# Patient Record
Sex: Female | Born: 1990 | Race: Black or African American | Hispanic: No | Marital: Single | Smoking: Never smoker
Health system: Southern US, Community
[De-identification: ages and names within clinical notes are randomized; demographics above are authoritative.]

---

## 2014-11-04 ENCOUNTER — Encounter (HOSPITAL_COMMUNITY): Payer: Self-pay | Admitting: *Deleted

## 2014-11-04 ENCOUNTER — Ambulatory Visit (HOSPITAL_COMMUNITY): Payer: 59

## 2014-11-04 ENCOUNTER — Emergency Department (HOSPITAL_COMMUNITY)
Admission: EM | Admit: 2014-11-04 | Discharge: 2014-11-04 | Disposition: A | Discharge status: Home / Self Care | Payer: 59 | Attending: Emergency Medicine | Admitting: Emergency Medicine

## 2014-11-04 DIAGNOSIS — Z3202 Encounter for pregnancy test, result negative: Secondary | ICD-10-CM | POA: Insufficient documentation

## 2014-11-04 DIAGNOSIS — R1031 Right lower quadrant pain: Secondary | ICD-10-CM | POA: Diagnosis present

## 2014-11-04 LAB — BASIC METABOLIC PANEL
Anion gap: 12 (ref 5–15)
BUN: 12 mg/dL (ref 6–20)
CO2: 22 mmol/L (ref 22–32)
Calcium: 9.4 mg/dL (ref 8.9–10.3)
Chloride: 101 mmol/L (ref 101–111)
Creatinine, Ser: 0.83 mg/dL (ref 0.44–1.00)
GFR calc Af Amer: >60 mL/min (ref >60)
GFR calc non Af Amer: >60 mL/min (ref >60)
GLUCOSE: 89 mg/dL (ref 70–99)
Potassium: 3.8 mmol/L (ref 3.5–5.1)
SODIUM: 135 mmol/L (ref 135–145)

## 2014-11-04 LAB — URINALYSIS, ROUTINE W REFLEX MICROSCOPIC
Bilirubin Urine: NEGATIVE
GLUCOSE, UA: NEGATIVE mg/dL
Hgb urine dipstick: NEGATIVE
Ketones, ur: NEGATIVE mg/dL
LEUKOCYTES UA: NEGATIVE
Nitrite: NEGATIVE
PH: 5 (ref 5.0–8.0)
PROTEIN: NEGATIVE mg/dL
Specific Gravity, Urine: 1.018 (ref 1.005–1.030)
UROBILINOGEN UA: 0.2 mg/dL (ref 0.0–1.0)

## 2014-11-04 LAB — CBC WITH DIFFERENTIAL/PLATELET
Basophils Absolute: 0 10*3/uL (ref 0.0–0.1)
Basophils Relative: 0 % (ref 0–1)
EOS ABS: 0.1 10*3/uL (ref 0.0–0.7)
Eosinophils Relative: 1 % (ref 0–5)
HEMATOCRIT: 36.7 % (ref 36.0–46.0)
HEMOGLOBIN: 11.5 g/dL — AB (ref 12.0–15.0)
LYMPHS ABS: 2.6 10*3/uL (ref 0.7–4.0)
Lymphocytes Relative: 32 % (ref 12–46)
MCH: 27.2 pg (ref 26.0–34.0)
MCHC: 31.3 g/dL (ref 30.0–36.0)
MCV: 86.8 fL (ref 78.0–100.0)
MONO ABS: 0.5 10*3/uL (ref 0.1–1.0)
MONOS PCT: 6 % (ref 3–12)
Neutro Abs: 4.9 10*3/uL (ref 1.7–7.7)
Neutrophils Relative %: 61 % (ref 43–77)
Platelets: 290 10*3/uL (ref 150–400)
RBC: 4.23 MIL/uL (ref 3.87–5.11)
RDW: 13.2 % (ref 11.5–15.5)
WBC: 8.1 10*3/uL (ref 4.0–10.5)

## 2014-11-04 LAB — GC/CHLAMYDIA PROBE AMP (~~LOC~~) NOT AT ARMC
Chlamydia: POSITIVE — AB
Neisseria Gonorrhea: NEGATIVE

## 2014-11-04 LAB — WET PREP, GENITAL
CLUE CELLS WET PREP: NONE SEEN
TRICH WET PREP: NONE SEEN
Yeast Wet Prep HPF POC: NONE SEEN

## 2014-11-04 LAB — PREGNANCY, URINE: Preg Test, Ur: NEGATIVE

## 2014-11-04 MED ORDER — ONDANSETRON HCL 4 MG/2ML IJ SOLN
4.0000 mg | Freq: Once | INTRAMUSCULAR | Status: AC
  Administered 2014-11-04: 4 mg via INTRAVENOUS
  Filled 2014-11-04: qty 2

## 2014-11-04 MED ORDER — SODIUM CHLORIDE 0.9 % IV BOLUS (SEPSIS)
1000.0000 mL | Freq: Once | INTRAVENOUS | Status: AC
  Administered 2014-11-04: 1000 mL via INTRAVENOUS

## 2014-11-04 MED ORDER — FENTANYL CITRATE (PF) 100 MCG/2ML IJ SOLN
50.0000 ug | Freq: Once | INTRAMUSCULAR | Status: DC
  Filled 2014-11-04: qty 2

## 2014-11-04 MED ORDER — IOHEXOL 300 MG/ML  SOLN
100.0000 mL | Freq: Once | INTRAMUSCULAR | Status: AC | PRN
  Administered 2014-11-04: 100 mL via INTRAVENOUS

## 2014-11-04 MED ORDER — IOHEXOL 300 MG/ML  SOLN
50.0000 mL | Freq: Once | INTRAMUSCULAR | Status: AC | PRN
Start: 2014-11-04 — End: 2014-11-04
  Administered 2014-11-04: 50 mL via ORAL

## 2014-11-04 NOTE — ED Notes (Signed)
PT states that she woke up this am with RLQ pain; pt states "It still hurts"; pt denies N/V/D; pt denies vaginal discharge

## 2014-11-04 NOTE — ED Notes (Signed)
Denies nausea, vomiting, diarrhea, fever, lor vaginal discharge.

## 2014-11-04 NOTE — ED Notes (Addendum)
Pt complete contrast; awaiting CT.

## 2014-11-04 NOTE — Discharge Instructions (Signed)
Abdominal Pain, Women °Abdominal (stomach, pelvic, or belly) pain can be caused by many things. It is important to tell your doctor: °· The location of the pain. °· Does it come and go or is it present all the time? °· Are there things that start the pain (eating certain foods, exercise)? °· Are there other symptoms associated with the pain (fever, nausea, vomiting, diarrhea)? °All of this is helpful to know when trying to find the cause of the pain. °CAUSES  °· Stomach: virus or bacteria infection, or ulcer. °· Intestine: appendicitis (inflamed appendix), regional ileitis (Crohn's disease), ulcerative colitis (inflamed colon), irritable bowel syndrome, diverticulitis (inflamed diverticulum of the colon), or cancer of the stomach or intestine. °· Gallbladder disease or stones in the gallbladder. °· Kidney disease, kidney stones, or infection. °· Pancreas infection or cancer. °· Fibromyalgia (pain disorder). °· Diseases of the female organs: °¨ Uterus: fibroid (non-cancerous) tumors or infection. °¨ Fallopian tubes: infection or tubal pregnancy. °¨ Ovary: cysts or tumors. °¨ Pelvic adhesions (scar tissue). °¨ Endometriosis (uterus lining tissue growing in the pelvis and on the pelvic organs). °¨ Pelvic congestion syndrome (female organs filling up with blood just before the menstrual period). °¨ Pain with the menstrual period. °¨ Pain with ovulation (producing an egg). °¨ Pain with an IUD (intrauterine device, birth control) in the uterus. °¨ Cancer of the female organs. °· Functional pain (pain not caused by a disease, may improve without treatment). °· Psychological pain. °· Depression. °DIAGNOSIS  °Your doctor will decide the seriousness of your pain by doing an examination. °· Blood tests. °· X-rays. °· Ultrasound. °· CT scan (computed tomography, special type of X-ray). °· MRI (magnetic resonance imaging). °· Cultures, for infection. °· Barium enema (dye inserted in the large intestine, to better view it with  X-rays). °· Colonoscopy (looking in intestine with a lighted tube). °· Laparoscopy (minor surgery, looking in abdomen with a lighted tube). °· Major abdominal exploratory surgery (looking in abdomen with a large incision). °TREATMENT  °The treatment will depend on the cause of the pain.  °· Many cases can be observed and treated at home. °· Over-the-counter medicines recommended by your caregiver. °· Prescription medicine. °· Antibiotics, for infection. °· Birth control pills, for painful periods or for ovulation pain. °· Hormone treatment, for endometriosis. °· Nerve blocking injections. °· Physical therapy. °· Antidepressants. °· Counseling with a psychologist or psychiatrist. °· Minor or major surgery. °HOME CARE INSTRUCTIONS  °· Do not take laxatives, unless directed by your caregiver. °· Take over-the-counter pain medicine only if ordered by your caregiver. Do not take aspirin because it can cause an upset stomach or bleeding. °· Try a clear liquid diet (broth or water) as ordered by your caregiver. Slowly move to a bland diet, as tolerated, if the pain is related to the stomach or intestine. °· Have a thermometer and take your temperature several times a day, and record it. °· Bed rest and sleep, if it helps the pain. °· Avoid sexual intercourse, if it causes pain. °· Avoid stressful situations. °· Keep your follow-up appointments and tests, as your caregiver orders. °· If the pain does not go away with medicine or surgery, you may try: °¨ Acupuncture. °¨ Relaxation exercises (yoga, meditation). °¨ Group therapy. °¨ Counseling. °SEEK MEDICAL CARE IF:  °· You notice certain foods cause stomach pain. °· Your home care treatment is not helping your pain. °· You need stronger pain medicine. °· You want your IUD removed. °· You feel faint or   lightheaded. °· You develop nausea and vomiting. °· You develop a rash. °· You are having side effects or an allergy to your medicine. °SEEK IMMEDIATE MEDICAL CARE IF:  °· Your  pain does not go away or gets worse. °· You have a fever. °· Your pain is felt only in portions of the abdomen. The right side could possibly be appendicitis. The left lower portion of the abdomen could be colitis or diverticulitis. °· You are passing blood in your stools (bright red or black tarry stools, with or without vomiting). °· You have blood in your urine. °· You develop chills, with or without a fever. °· You pass out. °MAKE SURE YOU:  °· Understand these instructions. °· Will watch your condition. °· Will get help right away if you are not doing well or get worse. °Document Released: 04/10/2007 Document Revised: 10/28/2013 Document Reviewed: 04/30/2009 °Exit[employer]® Patient Information ©2015 [employer]. This information is not intended to replace advice given to you by your health care provider. Make sure you discuss any questions you have with your health care provider. ° °

## 2014-11-04 NOTE — ED Provider Notes (Signed)
CSN: [id number]     Arrival date & time 11/04/14  [id number] History   First MD Initiated Contact with Patient 11/04/14 [phone]     Chief Complaint  Patient presents with  . Abdominal Pain     (Consider location/radiation/quality/duration/timing/severity/associated sxs/prior Treatment) HPI    PCP: No PCP Per Patient Blood pressure 119/71, pulse 89, temperature 98.4 F (36.9 C), temperature source Oral, resp. rate 16, height 5\' 1"  (1.549 m), weight 138 lb (62.596 kg), last menstrual period 10/10/2014, SpO2 99 %.  Laura Velazquez is a 24 y.o.female without any significant PMH presents to the ER with complaints of RLQ pain that started around 4 am this morning. The pain continues to persist so she presents to the ER for evaluation. She is resting comfortably but reports the pain as a 7/10. She denies hx of previous abdominal surgery. She describes the pain as crampy and intermittently sharp.  Negative Review of Symptoms:  She has not had any nausea, vomiting, diarrhea, dysuria, hematuria, vaginal discharge, vaginal bleeding, cough, chest pain, SOB, headache, fever, neck pain.   History reviewed. No pertinent past medical history. History reviewed. No pertinent past surgical history. No family history on file. History  Substance Use Topics  . Smoking status: Never Smoker   . Smokeless tobacco: Not on file  . Alcohol Use: Yes     Comment: socially   OB History    No data available     Review of Systems 10 Systems reviewed and are negative for acute change except as noted in the HPI.    Allergies  Review of patient's allergies indicates no known allergies.  Home Medications   Prior to Admission medications   Not on File   BP 111/59 mmHg  Pulse 76  Temp(Src) 98 F (36.7 C) (Oral)  Resp 16  Ht 5\' 1"  (1.549 m)  Wt 138 lb (62.596 kg)  BMI 26.09 kg/m2  SpO2 100%  LMP 10/10/2014 (Approximate) Physical Exam  Constitutional: She appears well-developed and well-nourished. No  distress.  HENT:  Head: Normocephalic and atraumatic.  Eyes: Pupils are equal, round, and reactive to light.  Neck: Normal range of motion. Neck supple.  Cardiovascular: Normal rate and regular rhythm.   Pulmonary/Chest: Effort normal.  Abdominal: Soft. Bowel sounds are normal. She exhibits no distension, no fluid wave and no ascites. There is tenderness (mild). There is no rigidity, no rebound, no guarding and no CVA tenderness.    abd soft and only mildly tender. No guarding or peritoneal signs.  Neurological: She is alert.  Skin: Skin is warm and dry.  Nursing note and vitals reviewed.   ED Course  Procedures (including critical care time) Labs Review Labs Reviewed  WET PREP, GENITAL - Abnormal; Notable for the following:    WBC, Wet Prep HPF POC FEW (*)    All other components within normal limits  CBC WITH DIFFERENTIAL/PLATELET - Abnormal; Notable for the following:    Hemoglobin 11.5 (*)    All other components within normal limits  BASIC METABOLIC PANEL  URINALYSIS, ROUTINE W REFLEX MICROSCOPIC  PREGNANCY, URINE  GC/CHLAMYDIA PROBE AMP ([place])    Imaging Review Ct Abdomen Pelvis W Contrast  11/04/2014   CLINICAL DATA:  Acute right lower quadrant abdominal pain.  EXAM: CT ABDOMEN AND PELVIS WITH CONTRAST  TECHNIQUE: Multidetector CT imaging of the abdomen and pelvis was performed using the standard protocol following bolus administration of intravenous contrast.  CONTRAST:  100mL OMNIPAQUE IOHEXOL 300 MG/ML  SOLN  COMPARISON:  None.  FINDINGS: Visualized lung bases appear normal. No significant osseous abnormality is noted.  No gallstones are noted. The liver, spleen and pancreas appear normal. Adrenal glands and kidneys appear normal. No hydronephrosis or renal obstruction is noted. There is no evidence of bowel obstruction. No abnormal fluid collection is noted. Uterus and right ovary appear normal. Probable involuting cyst is noted in left ovary. Urinary bladder  appears normal. The distal portion of the appendix is mildly thickened at 7 mm, but no surrounding inflammation is noted.  IMPRESSION: Distal portion of appendix is mildly thickened at 7 mm without surrounding inflammation. This is not diagnostic for appendicitis, but in the appropriate clinical setting, such as pain and elevated white count, early appendicitis cannot be excluded.   Electronically Signed   By: Lupita RaiderJames  Green Jr, M.D.   On: 11/04/2014 10:12     EKG Interpretation None      MDM   Final diagnoses:  Right lower quadrant abdominal pain    The patients pain resolved on arrival. Her urinalysis, urine preg and pelvic exam revealed no significant abnormalities. Due to the pain being in the RLQ a CT scan of the abdomen and pelvic was done-- this showed no diagnostic findings of appendicitis, but did say with continued abdominal pain, elevated WBC count that it could be early appendicits.  I had a long discussion with the patient about this. I assured her that this did not appear to be acute appendicitis but with no white count, her pain relieving on its own and being resolved for 2.5 hours without intervention, then this is very unlikely. She has been advised to return to the ED for surgical consult if that pain returns as early appy is possible. She has a final at 1pm and is agreeable/prefers home. Declines Rx for pain/nausea medications.  24 y.o.Laura Velazquez's evaluation in the Emergency Department is complete. It has been determined that no acute conditions requiring further emergency intervention are present at this time. The patient/guardian have been advised of the diagnosis and plan. We have discussed signs and symptoms that warrant return to the ED, such as changes or worsening in symptoms.  Vital signs are stable at discharge. Filed Vitals:   11/04/14 1043  BP: 111/59  Pulse: 76  Temp: 98 F (36.7 C)  Resp: 16    Patient/guardian has voiced understanding and agreed  to follow-up with the PCP or specialist.     Marlon Peliffany Canton Yearby, PA-C 11/04/14 1058  Marisa Severinlga Otter, MD 11/04/14 2307

## 2014-11-04 NOTE — ED Notes (Signed)
Pt returned from CT-- awaiting results

## 2014-11-04 NOTE — ED Notes (Signed)
Pt aware awaiting CT at present time.

## 2014-11-05 ENCOUNTER — Telehealth (HOSPITAL_BASED_OUTPATIENT_CLINIC_OR_DEPARTMENT_OTHER): Payer: Self-pay | Admitting: Emergency Medicine

## 2014-11-05 NOTE — Telephone Encounter (Signed)
Chart handoff to EDP for treatment plan + Chlamydia 

## 2014-11-07 ENCOUNTER — Telehealth (HOSPITAL_COMMUNITY): Payer: Self-pay | Admitting: *Deleted

## 2015-09-25 IMAGING — CT CT ABD-PELV W/ CM
2 of 4 series · 16 of 46 positions shown, 18 images · IV contrast (OMNIPAQUE 300)
Comparison: None.

CLINICAL DATA: Acute right lower quadrant abdominal pain.

EXAM:
CT ABDOMEN AND PELVIS WITH CONTRAST
TECHNIQUE: Multidetector CT imaging of the abdomen and pelvis was performed
using the standard protocol following bolus administration of
intravenous contrast.
CONTRAST:  100mL OMNIPAQUE IOHEXOL 300 MG/ML  SOLN

[Series 2: abd/pel with · axial · 0.59mm/px · z∈[-192,+198]mm · 13 of 86 slices shown, 15 images]
[im 4/86  soft-tissue]
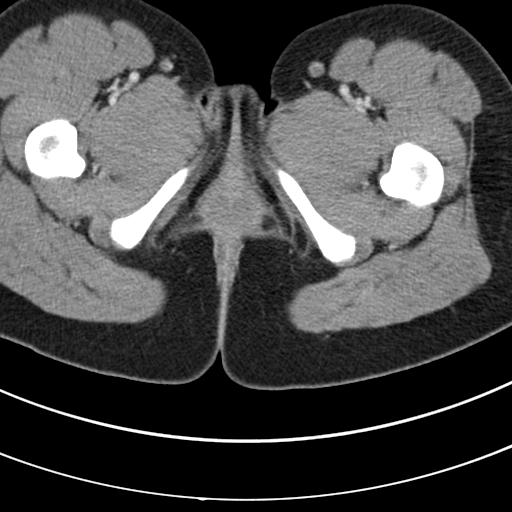
[im 4/86  bone]
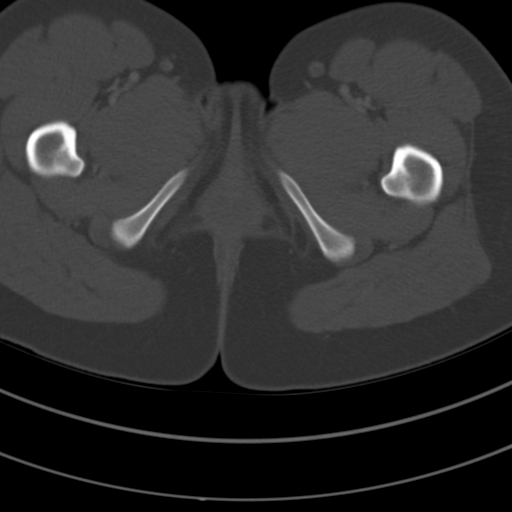
[im 11/86  soft-tissue]
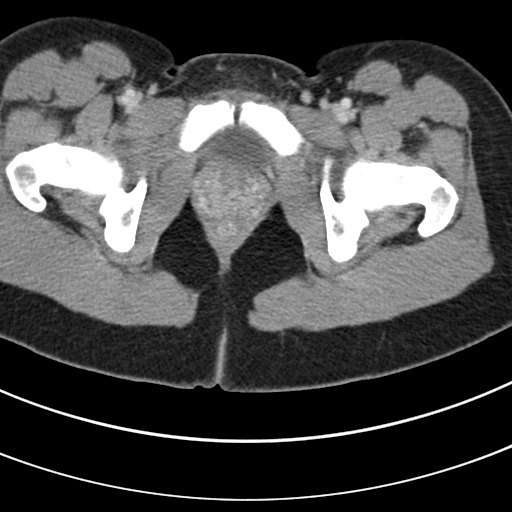
[im 18/86  soft-tissue]
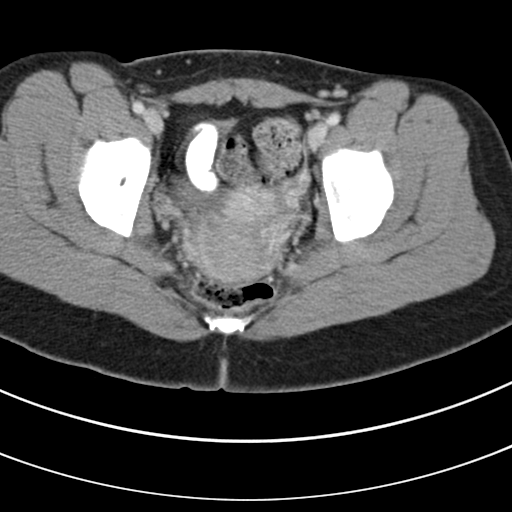
[im 24/86  soft-tissue]
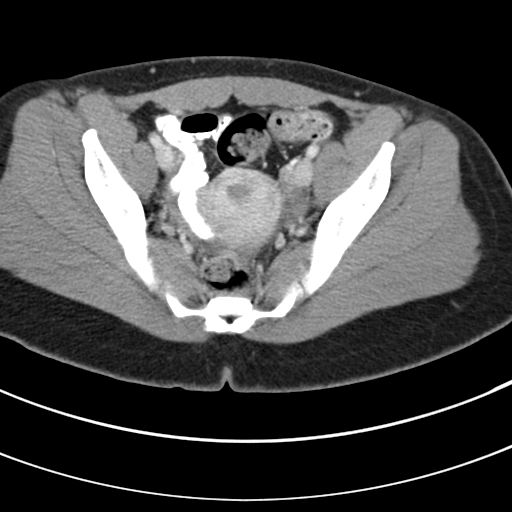
[im 31/86  soft-tissue]
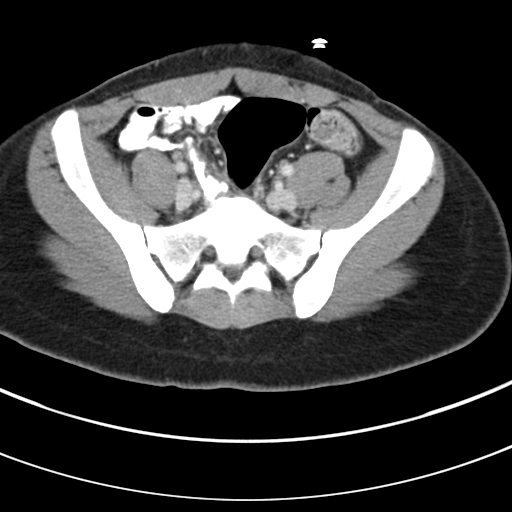
[im 38/86  soft-tissue]
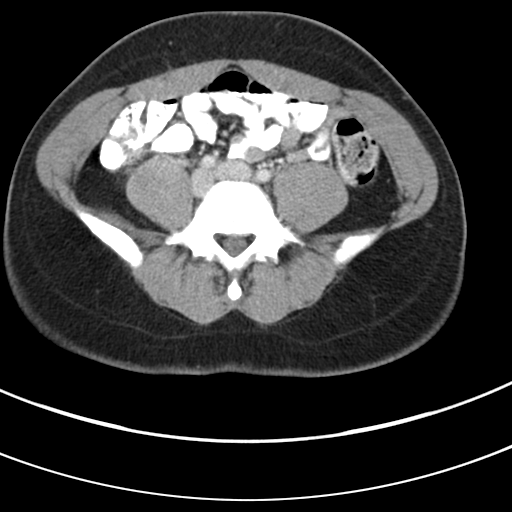
[im 45/86  soft-tissue]
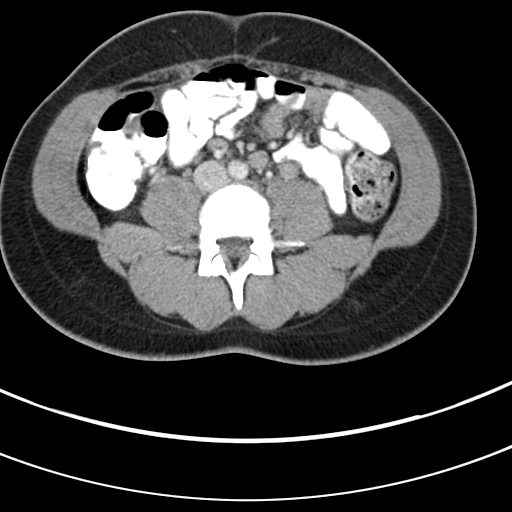
[im 48/86  soft-tissue]
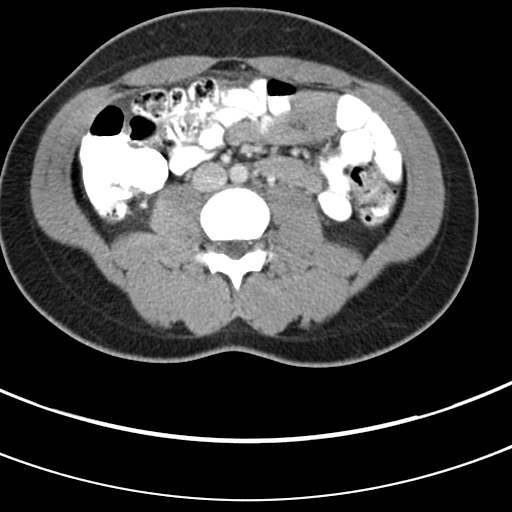
[im 55/86  soft-tissue]
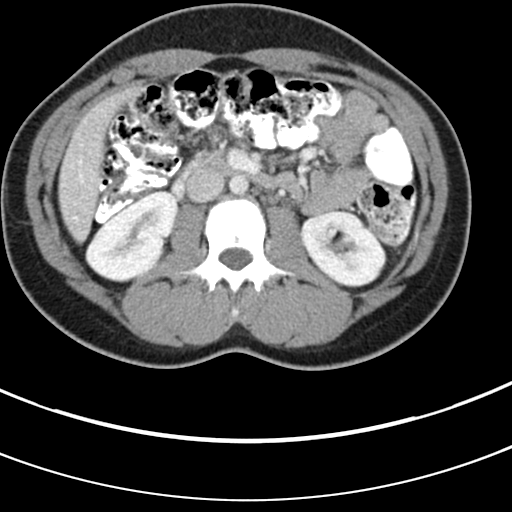
[im 55/86  bone]
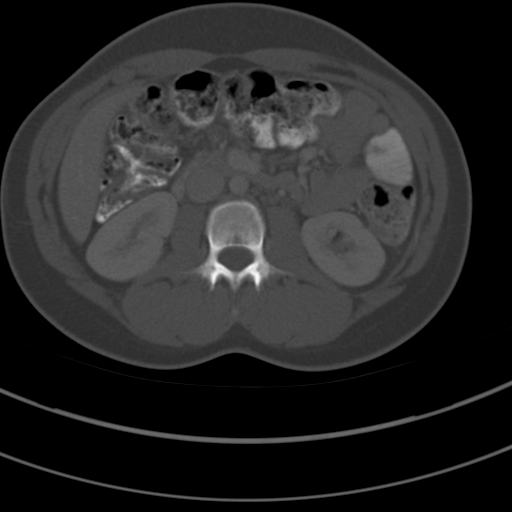
[im 62/86  soft-tissue]
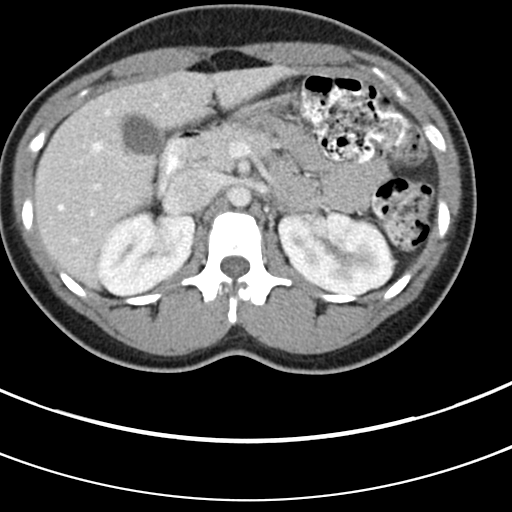
[im 69/86  soft-tissue]
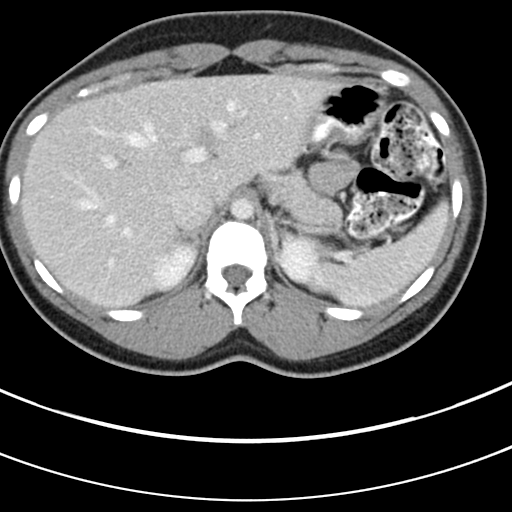
[im 75/86  soft-tissue]
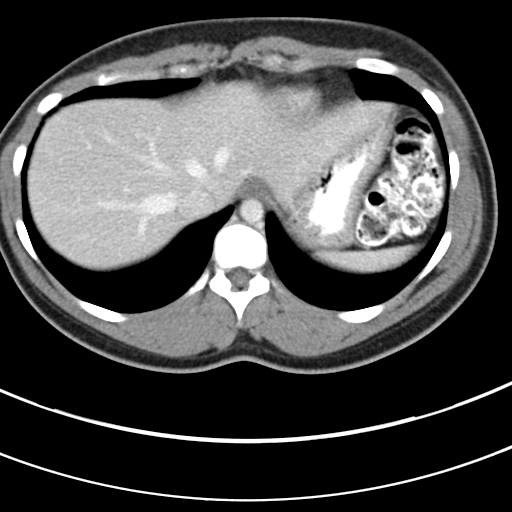
[im 82/86  soft-tissue]
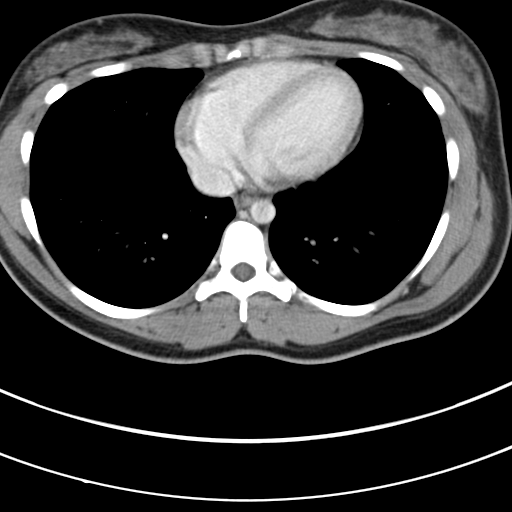

[Series 3: coronal a/|p · coronal · 0.63mm/px · 3 of 84 slices shown]
[im 28/84  soft-tissue]
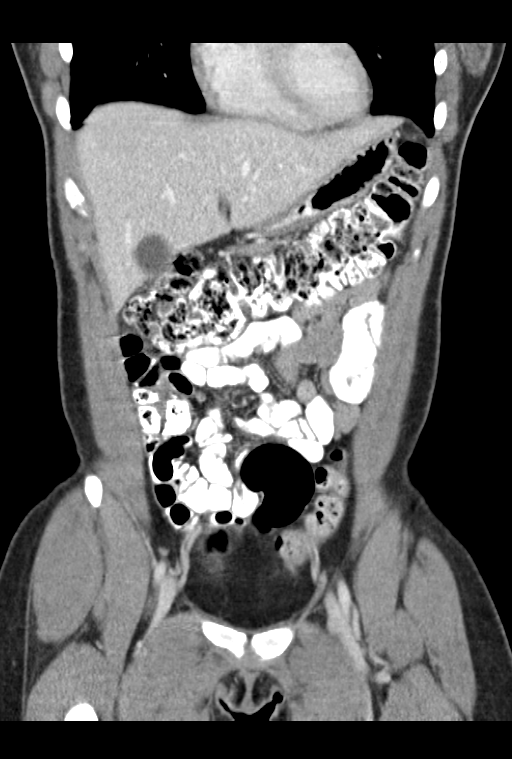
[im 37/84  soft-tissue]
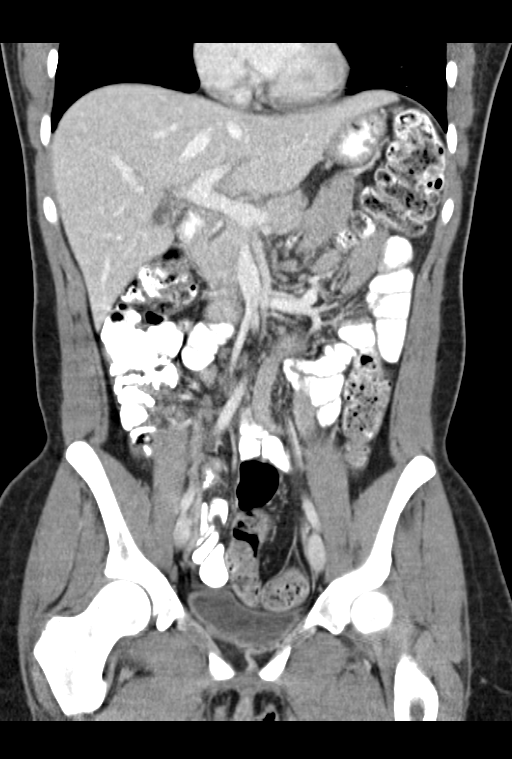
[im 47/84  soft-tissue]
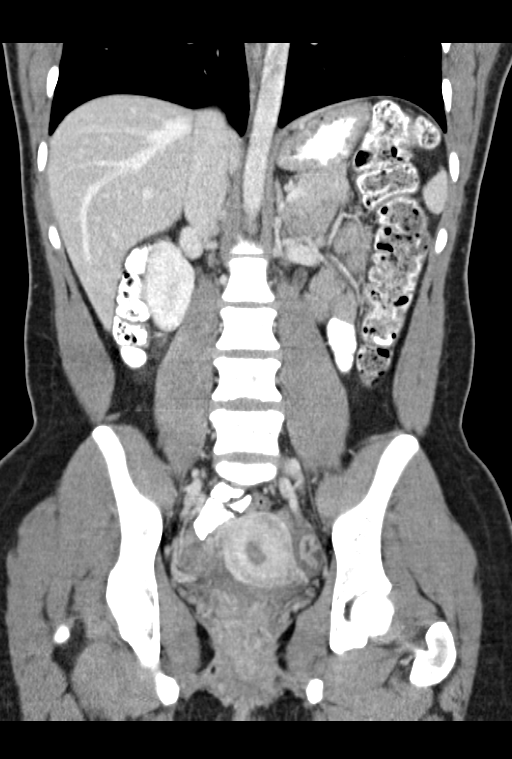

[16 of 46 positions shown; findings below may reference images not displayed]

FINDINGS: Visualized lung bases appear normal. No significant osseous
abnormality is noted.

No gallstones are noted. The liver, spleen and pancreas appear
normal. Adrenal glands and kidneys appear normal. No hydronephrosis
or renal obstruction is noted. There is no evidence of bowel
obstruction. No abnormal fluid collection is noted. Uterus and right
ovary appear normal. Probable involuting cyst is noted in left
ovary. Urinary bladder appears normal. The distal portion of the
appendix is mildly thickened at 7 mm, but no surrounding
inflammation is noted.
IMPRESSION: Distal portion of appendix is mildly thickened at 7 mm without
surrounding inflammation. This is not diagnostic for appendicitis,
but in the appropriate clinical setting, such as pain and elevated
white count, early appendicitis cannot be excluded.

## 2016-02-11 ENCOUNTER — Encounter (HOSPITAL_COMMUNITY): Payer: Self-pay | Admitting: Emergency Medicine

## 2016-02-11 ENCOUNTER — Inpatient Hospital Stay (HOSPITAL_COMMUNITY)
Admission: EM | Admit: 2016-02-11 | Discharge: 2016-02-14 | DRG: 602 | Disposition: A | Discharge status: Home / Self Care | Payer: Self-pay | Attending: Family Medicine | Admitting: Family Medicine

## 2016-02-11 DIAGNOSIS — L0501 Pilonidal cyst with abscess: Principal | ICD-10-CM | POA: Diagnosis present

## 2016-02-11 DIAGNOSIS — L03317 Cellulitis of buttock: Secondary | ICD-10-CM | POA: Diagnosis present

## 2016-02-11 DIAGNOSIS — L0291 Cutaneous abscess, unspecified: Secondary | ICD-10-CM | POA: Diagnosis present

## 2016-02-11 DIAGNOSIS — R000 Tachycardia, unspecified: Secondary | ICD-10-CM | POA: Diagnosis present

## 2016-02-11 DIAGNOSIS — A419 Sepsis, unspecified organism: Secondary | ICD-10-CM | POA: Diagnosis present

## 2016-02-11 DIAGNOSIS — Z888 Allergy status to other drugs, medicaments and biological substances status: Secondary | ICD-10-CM

## 2016-02-11 DIAGNOSIS — L0390 Cellulitis, unspecified: Secondary | ICD-10-CM

## 2016-02-11 DIAGNOSIS — R509 Fever, unspecified: Secondary | ICD-10-CM

## 2016-02-11 MED ORDER — SODIUM CHLORIDE 0.9 % IV SOLN
1000.0000 mL | INTRAVENOUS | Status: DC
  Administered 2016-02-12: 1000 mL via INTRAVENOUS

## 2016-02-11 MED ORDER — ACETAMINOPHEN 325 MG PO TABS
650.0000 mg | ORAL_TABLET | Freq: Once | ORAL | Status: AC | PRN
  Administered 2016-02-12: 650 mg via ORAL
  Filled 2016-02-11: qty 2

## 2016-02-11 NOTE — ED Triage Notes (Signed)
Pt states she has cysts x 2 around her coccyx area she noticed 3 days ago  Pt has a fever of 102.6 upon arrival  Pt states she has been taking ibuprofen for her pain  Last dose was this morning

## 2016-02-11 NOTE — ED Provider Notes (Signed)
[facility]-EMERGENCY DEPT Provider Note   CSN: [id number] Arrival date & time: 02/11/16  2021  By signing my name below, I, Laura Velazquez, attest that this documentation has been prepared under the direction and in the presence of TXU CorpHannah Janaa Acero, PA-C. Electronically Signed: Linna Darnerussell Velazquez, Scribe. 02/11/2016. 12:27 AM.   History   Chief Complaint Chief Complaint  Patient presents with  . Cyst    The history is provided by the patient. No language interpreter was used.     HPI Comments: Laura Velazquez is a 25 y.o. female who presents to the Emergency Department complaining of sudden onset, constant, worsening, cyst to her tailbone beginning three days ago. Pt reports she noticed her cyst a couple of days after shaving; she notes she shaves both sides of the front of her anus regularly. She rates her pain as 10/10 and states she cannot sit down due to pain. She states she has experienced abscesses in the past and has had an I&D performed. Pt has tried ibuprofen and hot baths with mild relief; her last dose of ibuprofen was yesterday morning. She notes she has experienced some drainage from her current cyst. Pt states she wears thongs occasionally. Pt states she doesn't feel like she has a fever but has a measured temperature of 102.6 tonight. Pt denies nausea, vomiting, or any other associated symptoms.  History reviewed. No pertinent past medical history.  Patient Active Problem List   Diagnosis Date Noted  . Pilonidal abscess 02/12/2016    History reviewed. No pertinent surgical history.  OB History    No data available       Home Medications    Prior to Admission medications   Medication Sig Start Date End Date Taking? Authorizing Provider  ibuprofen (ADVIL,MOTRIN) 200 MG tablet Take 400 mg by mouth every 6 (six) hours as needed for moderate pain.   Yes Historical Provider, MD    Family History History reviewed. No pertinent family history.  Social History Social  History  Substance Use Topics  . Smoking status: Never Smoker  . Smokeless tobacco: Never Used  . Alcohol use No     Comment: socially     Allergies   Vancomycin   Review of Systems Review of Systems  Constitutional: Positive for fever.  Gastrointestinal: Negative for nausea and vomiting.  Skin: Positive for wound (cyst).  All other systems reviewed and are negative.   Physical Exam Updated Vital Signs BP 110/67 (BP Location: Left Arm)   Pulse 110   Temp 100.4 F (38 C) (Oral)   Resp 18   Ht 5' (1.524 m)   Wt 61.7 kg   LMP 02/06/2016 (Exact Date) Comment: negative urine pregnancy test 02/12/16  SpO2 98%   BMI 26.56 kg/m   Physical Exam  Constitutional: She appears well-developed and well-nourished. No distress.  Awake, alert, nontoxic appearance  HENT:  Head: Normocephalic and atraumatic.  Mouth/Throat: Oropharynx is clear and moist. No oropharyngeal exudate.  Eyes: Conjunctivae are normal. No scleral icterus.  Neck: Normal range of motion. Neck supple.  Cardiovascular: Normal rate, regular rhythm and intact distal pulses.   Pulmonary/Chest: Effort normal and breath sounds normal. No respiratory distress. She has no wheezes.  Equal chest expansion  Abdominal: Soft. Bowel sounds are normal. She exhibits no mass. There is no tenderness. There is no rebound and no guarding.  Musculoskeletal: Normal range of motion. She exhibits no edema.  Neurological: She is alert.  Speech is clear and goal oriented Moves extremities without ataxia  Skin: Skin is warm and dry. She is not diaphoretic.  Psychiatric: She has a normal mood and affect.  Nursing note and vitals reviewed.   ED Treatments / Results  Labs (all labs ordered are listed, but only abnormal results are displayed) Labs Reviewed  CBC WITH DIFFERENTIAL/PLATELET - Abnormal; Notable for the following:       Result Value   WBC 14.9 (*)    Hemoglobin 10.8 (*)    HCT 33.8 (*)    Neutro Abs 11.7 (*)    All  other components within normal limits  URINALYSIS, ROUTINE W REFLEX MICROSCOPIC (NOT AT [facility]) - Abnormal; Notable for the following:    Ketones, ur 15 (*)    All other components within normal limits  CULTURE, BLOOD (ROUTINE X 2)  CULTURE, BLOOD (ROUTINE X 2)  URINE CULTURE  AEROBIC CULTURE (SUPERFICIAL SPECIMEN)  COMPREHENSIVE METABOLIC PANEL  I-STAT CG4 LACTIC ACID, ED  I-STAT CG4 LACTIC ACID, ED  POC URINE PREG, ED    Procedures .Marland KitchenIncision and Drainage Date/Time: 02/12/2016 4:44 AM Performed by: Dierdre Forth Authorized by: Dierdre Forth   Consent:    Consent obtained:  Verbal   Consent given by:  Patient   Risks discussed:  Bleeding, incomplete drainage and pain   Alternatives discussed:  No treatment Location:    Type:  Abscess   Location:  Anogenital   Anogenital location:  Gluteal cleft Pre-procedure details:    Skin preparation:  Betadine Anesthesia (see MAR for exact dosages):    Anesthesia method:  Local infiltration   Local anesthetic:  Lidocaine 2% WITH epi Procedure type:    Complexity:  Complex Procedure details:    Needle aspiration: no     Incision types:  Single straight   Incision depth:  Subcutaneous   Scalpel blade:  11   Wound management:  Probed and deloculated, irrigated with saline and extensive cleaning   Drainage:  Bloody and purulent   Drainage amount:  Copious   Wound treatment:  Wound left open   Packing materials:  1/4 in iodoform gauze   Amount 1/4" iodoform:  12-15" Post-procedure details:    Patient tolerance of procedure:  Tolerated well, no immediate complications [place] bedside Date/Time: 02/12/2016 12:16 AM Performed by: Dierdre Forth Authorized by: Dierdre Forth  Consent: Verbal consent obtained. Consent given by: patient Patient understanding: patient states understanding of the procedure being performed Patient identity confirmed: verbally with patient Time out: Immediately prior to procedure a  "time out" was called to verify the correct patient, procedure, equipment, support staff and site/side marked as required. Preparation: Patient was prepped and draped in the usual sterile fashion. Local anesthesia used: no  Anesthesia: Local anesthesia used: no  Sedation: Patient sedated: no Patient tolerance: Patient tolerated the procedure well with no immediate complications Comments:  EMERGENCY DEPARTMENT US SOFT TISSUE INTERPRETATION "Study: Limited Ultrasound of the noted body part in comments below"  INDICATIONS: Pain and Soft tissue infection Multiple views of the body part are obtained with a multi-frequency linear probe   PERFORMED BY:  Myself  IMAGES ARCHIVED?: Yes  SIDE:Midline  BODY PART:Other soft tisse (comment in note)  FINDINGS: Abcess present and Cellulitis absent  LIMITATIONS:  Body Habitus  INTERPRETATION:  Abcess present and Cellulitis present  COMMENT:  Very large pilonidal abscess to the midline gluteal cleft with extension into the right and left buttock     (including critical care time)  DIAGNOSTIC STUDIES: Oxygen Saturation is 100% on RA, normal by my interpretation.  COORDINATION OF CARE: 12:27 AM Discussed treatment plan with pt at bedside and pt agreed to plan.  Medications Ordered in ED Medications  0.9 %  sodium chloride infusion (1,000 mLs Intravenous New Bag/Given 02/12/16 0307)  lidocaine-EPINEPHrine (XYLOCAINE W/EPI) 2 %-1:100000 (with pres) injection 10 mL (not administered)  piperacillin-tazobactam (ZOSYN) IVPB 3.375 g (not administered)  acetaminophen (TYLENOL) tablet 650 mg (650 mg Oral Given 02/12/16 0106)  oxyCODONE (Oxy IR/ROXICODONE) immediate release tablet 10 mg (10 mg Oral Given 02/12/16 0110)  sodium chloride 0.9 % bolus 1,000 mL (0 mLs Intravenous Stopped 02/12/16 0416)    And  sodium chloride 0.9 % bolus 1,000 mL (0 mLs Intravenous Stopped 02/12/16 0243)  piperacillin-tazobactam (ZOSYN) IVPB 3.375 g (0 g Intravenous  Stopped 02/12/16 0242)  vancomycin (VANCOCIN) IVPB 1000 mg/200 mL premix (0 mg Intravenous Stopped 02/12/16 0416)  diphenhydrAMINE (BENADRYL) injection 50 mg (50 mg Intravenous Given 02/12/16 0316)  iopamidol (ISOVUE-300) 61 % injection 100 mL (100 mLs Intravenous Contrast Given 02/12/16 0542)     Initial Impression / Assessment and Plan / ED Course  I have reviewed the triage vital signs and the nursing notes.  Pertinent labs & imaging results that were available during my care of the patient were reviewed by me and considered in my medical decision making (see chart for details).  Clinical Course  Value Comment By Time  WBC: (!) 14.9 Leukocytosis Dierdre ForthHannah Jaxxson Cavanah, PA-C 08/18 0450  Lactic Acid, Venous: 0.68 Lactic acid WNL Khyri Hinzman, PA-C 08/18 0451  Temp: 102.6 F (39.2 C) Febrile with tachycardia on arrival.   Pt remains with low grade fever and tachycardia after weight based fluid bolus complete.   Dierdre ForthHannah Derion Kreiter, PA-C 08/18 0451  Preg Test, Ur: NEGATIVE Preg neg Dierdre ForthHannah Berdell Nevitt, PA-C 08/18 0557   Pt with very large pilonidal abscess in the midline gluteal left. She is febrile and tachycardic.  I&D performed, but pt remains tachycardic after fluids and antiemetics.  Lactic not elevated, but concern for progressing infection.  Pt given vanc and zosyn.  After administration of Vancomycin, pt began to itch significantly.  She was given benadryl and reaction did not progress.    Pt will need admission for IV abx and monitoring.    I personally performed the services described in this documentation, which was scribed in my presence. The recorded information has been reviewed and is accurate.   Final Clinical Impressions(s) / ED Diagnoses   Final diagnoses:  Pilonidal cyst with abscess  Fever, unspecified fever cause  Tachycardia    New Prescriptions New Prescriptions   No medications on file     Dierdre ForthHannah Danene Montijo, PA-C 02/12/16 0559    Dione Boozeavid Glick,  MD 02/12/16 [id number]    Dione Boozeavid Glick, MD 02/12/16 [phone]

## 2016-02-12 ENCOUNTER — Observation Stay (HOSPITAL_COMMUNITY): Payer: Self-pay

## 2016-02-12 ENCOUNTER — Encounter (HOSPITAL_COMMUNITY): Payer: Self-pay | Admitting: Internal Medicine

## 2016-02-12 DIAGNOSIS — L0390 Cellulitis, unspecified: Secondary | ICD-10-CM

## 2016-02-12 DIAGNOSIS — R509 Fever, unspecified: Secondary | ICD-10-CM

## 2016-02-12 DIAGNOSIS — R000 Tachycardia, unspecified: Secondary | ICD-10-CM

## 2016-02-12 DIAGNOSIS — L0291 Cutaneous abscess, unspecified: Secondary | ICD-10-CM | POA: Diagnosis present

## 2016-02-12 DIAGNOSIS — L0501 Pilonidal cyst with abscess: Principal | ICD-10-CM

## 2016-02-12 DIAGNOSIS — A419 Sepsis, unspecified organism: Secondary | ICD-10-CM

## 2016-02-12 LAB — CBC
HEMATOCRIT: 31.1 % — AB (ref 36.0–46.0)
HEMOGLOBIN: 9.8 g/dL — AB (ref 12.0–15.0)
MCH: 27.4 pg (ref 26.0–34.0)
MCHC: 31.5 g/dL (ref 30.0–36.0)
MCV: 86.9 fL (ref 78.0–100.0)
Platelets: 314 10*3/uL (ref 150–400)
RBC: 3.58 MIL/uL — ABNORMAL LOW (ref 3.87–5.11)
RDW: 13.6 % (ref 11.5–15.5)
WBC: 15.3 10*3/uL — AB (ref 4.0–10.5)

## 2016-02-12 LAB — COMPREHENSIVE METABOLIC PANEL
ALT: 16 U/L (ref 14–54)
ANION GAP: 8 (ref 5–15)
AST: 17 U/L (ref 15–41)
Albumin: 4.3 g/dL (ref 3.5–5.0)
Alkaline Phosphatase: 40 U/L (ref 38–126)
BUN: 12 mg/dL (ref 6–20)
CHLORIDE: 106 mmol/L (ref 101–111)
CO2: 23 mmol/L (ref 22–32)
Calcium: 9.2 mg/dL (ref 8.9–10.3)
Creatinine, Ser: 0.8 mg/dL (ref 0.44–1.00)
GFR calc Af Amer: >60 mL/min (ref >60)
GFR calc non Af Amer: >60 mL/min (ref >60)
Glucose, Bld: 94 mg/dL (ref 65–99)
Potassium: 3.7 mmol/L (ref 3.5–5.1)
SODIUM: 137 mmol/L (ref 135–145)
Total Bilirubin: 0.4 mg/dL (ref 0.3–1.2)
Total Protein: 8.1 g/dL (ref 6.5–8.1)

## 2016-02-12 LAB — I-STAT CG4 LACTIC ACID, ED
LACTIC ACID, VENOUS: 0.68 mmol/L (ref 0.5–1.9)
LACTIC ACID, VENOUS: 1.58 mmol/L (ref 0.5–1.9)

## 2016-02-12 LAB — URINALYSIS, ROUTINE W REFLEX MICROSCOPIC
Bilirubin Urine: NEGATIVE
Glucose, UA: NEGATIVE mg/dL
Hgb urine dipstick: NEGATIVE
Ketones, ur: 15 mg/dL — AB
Leukocytes, UA: NEGATIVE
NITRITE: NEGATIVE
Protein, ur: NEGATIVE mg/dL
SPECIFIC GRAVITY, URINE: 1.022 (ref 1.005–1.030)
pH: 5 (ref 5.0–8.0)

## 2016-02-12 LAB — POC URINE PREG, ED: Preg Test, Ur: NEGATIVE

## 2016-02-12 LAB — CBC WITH DIFFERENTIAL/PLATELET
BASOS PCT: 0 %
Basophils Absolute: 0 10*3/uL (ref 0.0–0.1)
Eosinophils Absolute: 0 10*3/uL (ref 0.0–0.7)
Eosinophils Relative: 0 %
HEMATOCRIT: 33.8 % — AB (ref 36.0–46.0)
HEMOGLOBIN: 10.8 g/dL — AB (ref 12.0–15.0)
LYMPHS ABS: 2.3 10*3/uL (ref 0.7–4.0)
Lymphocytes Relative: 15 %
MCH: 27.4 pg (ref 26.0–34.0)
MCHC: 32 g/dL (ref 30.0–36.0)
MCV: 85.8 fL (ref 78.0–100.0)
MONOS PCT: 6 %
Monocytes Absolute: 0.9 10*3/uL (ref 0.1–1.0)
NEUTROS PCT: 79 %
Neutro Abs: 11.7 10*3/uL — ABNORMAL HIGH (ref 1.7–7.7)
Platelets: 335 10*3/uL (ref 150–400)
RBC: 3.94 MIL/uL (ref 3.87–5.11)
RDW: 13.3 % (ref 11.5–15.5)
WBC: 14.9 10*3/uL — ABNORMAL HIGH (ref 4.0–10.5)

## 2016-02-12 LAB — PROTIME-INR
INR: 1.16
PROTHROMBIN TIME: 14.8 s (ref 11.4–15.2)

## 2016-02-12 LAB — CREATININE, SERUM: Creatinine, Ser: 0.81 mg/dL (ref 0.44–1.00)

## 2016-02-12 LAB — APTT: APTT: 37 s — AB (ref 24–36)

## 2016-02-12 MED ORDER — KETOROLAC TROMETHAMINE 15 MG/ML IJ SOLN: 15.0000 mg | Freq: Four times a day (QID) | INTRAMUSCULAR | Status: DC | PRN

## 2016-02-12 MED ORDER — VANCOMYCIN HCL IN DEXTROSE 1-5 GM/200ML-% IV SOLN
1000.0000 mg | Freq: Once | INTRAVENOUS | Status: AC
  Administered 2016-02-12: 1000 mg via INTRAVENOUS
  Filled 2016-02-12: qty 200

## 2016-02-12 MED ORDER — ACETAMINOPHEN 325 MG PO TABS
650.0000 mg | ORAL_TABLET | Freq: Once | ORAL | Status: AC
  Administered 2016-02-12: 650 mg via ORAL
  Filled 2016-02-12: qty 2

## 2016-02-12 MED ORDER — OXYCODONE HCL 5 MG PO TABS
10.0000 mg | ORAL_TABLET | Freq: Once | ORAL | Status: AC
  Administered 2016-02-12: 10 mg via ORAL
  Filled 2016-02-12: qty 2

## 2016-02-12 MED ORDER — IBUPROFEN 400 MG PO TABS
400.0000 mg | ORAL_TABLET | Freq: Four times a day (QID) | ORAL | Status: DC | PRN
  Administered 2016-02-12 – 2016-02-13 (×4): 400 mg via ORAL
  Filled 2016-02-12 (×4): qty 1

## 2016-02-12 MED ORDER — DIPHENHYDRAMINE HCL 50 MG/ML IJ SOLN
50.0000 mg | Freq: Once | INTRAMUSCULAR | Status: AC
  Administered 2016-02-12: 50 mg via INTRAVENOUS
  Filled 2016-02-12: qty 1

## 2016-02-12 MED ORDER — VANCOMYCIN HCL IN DEXTROSE 1-5 GM/200ML-% IV SOLN
1000.0000 mg | Freq: Two times a day (BID) | INTRAVENOUS | Status: DC
  Administered 2016-02-12 – 2016-02-14 (×4): 1000 mg via INTRAVENOUS
  Filled 2016-02-12 (×5): qty 200

## 2016-02-12 MED ORDER — ENOXAPARIN SODIUM 40 MG/0.4ML ~~LOC~~ SOLN
40.0000 mg | SUBCUTANEOUS | Status: DC
  Administered 2016-02-12 – 2016-02-14 (×3): 40 mg via SUBCUTANEOUS
  Filled 2016-02-12 (×3): qty 0.4

## 2016-02-12 MED ORDER — CLINDAMYCIN HCL 300 MG PO CAPS: 300.0000 mg | ORAL_CAPSULE | Freq: Four times a day (QID) | ORAL | 0 refills | Status: DC

## 2016-02-12 MED ORDER — SODIUM CHLORIDE 0.9 % IV BOLUS (SEPSIS)
1000.0000 mL | Freq: Once | INTRAVENOUS | Status: AC
  Administered 2016-02-12: 1000 mL via INTRAVENOUS

## 2016-02-12 MED ORDER — LIDOCAINE-EPINEPHRINE 2 %-1:100000 IJ SOLN
10.0000 mL | Freq: Once | INTRAMUSCULAR | Status: AC
  Administered 2016-02-12: 10 mL
  Filled 2016-02-12: qty 1

## 2016-02-12 MED ORDER — ONDANSETRON HCL 4 MG/2ML IJ SOLN: 4.0000 mg | Freq: Four times a day (QID) | INTRAMUSCULAR | Status: DC | PRN

## 2016-02-12 MED ORDER — ONDANSETRON HCL 4 MG PO TABS
4.0000 mg | ORAL_TABLET | Freq: Four times a day (QID) | ORAL | Status: DC | PRN
Start: 2016-02-12 — End: 2016-02-14

## 2016-02-12 MED ORDER — SODIUM CHLORIDE 0.9 % IV BOLUS (SEPSIS)
250.0000 mL | Freq: Once | INTRAVENOUS | Status: AC
  Administered 2016-02-12: 250 mL via INTRAVENOUS

## 2016-02-12 MED ORDER — SODIUM CHLORIDE 0.9 % IV SOLN: 1000.0000 mL | INTRAVENOUS | Status: DC

## 2016-02-12 MED ORDER — METRONIDAZOLE IN NACL 5-0.79 MG/ML-% IV SOLN
500.0000 mg | Freq: Three times a day (TID) | INTRAVENOUS | Status: DC
  Administered 2016-02-12 – 2016-02-14 (×7): 500 mg via INTRAVENOUS
  Filled 2016-02-12 (×7): qty 100

## 2016-02-12 MED ORDER — DEXTROSE 5 % IV SOLN
1.0000 g | INTRAVENOUS | Status: DC
  Administered 2016-02-12 – 2016-02-14 (×3): 1 g via INTRAVENOUS
  Filled 2016-02-12 (×3): qty 10

## 2016-02-12 MED ORDER — OXYCODONE-ACETAMINOPHEN 5-325 MG PO TABS: 1.0000 | ORAL_TABLET | ORAL | 0 refills | Status: DC | PRN

## 2016-02-12 MED ORDER — ONDANSETRON HCL 4 MG/2ML IJ SOLN: 4.0000 mg | Freq: Three times a day (TID) | INTRAMUSCULAR | Status: DC | PRN

## 2016-02-12 MED ORDER — IOPAMIDOL (ISOVUE-300) INJECTION 61%
100.0000 mL | Freq: Once | INTRAVENOUS | Status: AC | PRN
  Administered 2016-02-12: 100 mL via INTRAVENOUS

## 2016-02-12 MED ORDER — DIPHENHYDRAMINE HCL 25 MG PO CAPS
25.0000 mg | ORAL_CAPSULE | Freq: Two times a day (BID) | ORAL | Status: DC
  Administered 2016-02-12 – 2016-02-14 (×4): 25 mg via ORAL
  Filled 2016-02-12 (×4): qty 1

## 2016-02-12 MED ORDER — POLYETHYLENE GLYCOL 3350 17 G PO PACK: 17.0000 g | PACK | Freq: Every day | ORAL | Status: DC | PRN

## 2016-02-12 MED ORDER — PIPERACILLIN-TAZOBACTAM 3.375 G IVPB
3.3750 g | Freq: Three times a day (TID) | INTRAVENOUS | Status: DC
  Filled 2016-02-12: qty 50

## 2016-02-12 MED ORDER — VANCOMYCIN HCL IN DEXTROSE 1-5 GM/200ML-% IV SOLN: 1000.0000 mg | Freq: Once | INTRAVENOUS | Status: DC

## 2016-02-12 MED ORDER — PIPERACILLIN-TAZOBACTAM 3.375 G IVPB 30 MIN
3.3750 g | Freq: Once | INTRAVENOUS | Status: AC
  Administered 2016-02-12: 3.375 g via INTRAVENOUS
  Filled 2016-02-12: qty 50

## 2016-02-12 MED ORDER — TRAMADOL HCL 50 MG PO TABS: 100.0000 mg | ORAL_TABLET | Freq: Four times a day (QID) | ORAL | Status: DC | PRN

## 2016-02-12 MED ORDER — VANCOMYCIN HCL IN DEXTROSE 1-5 GM/200ML-% IV SOLN
1000.0000 mg | Freq: Two times a day (BID) | INTRAVENOUS | Status: DC
  Filled 2016-02-12: qty 200

## 2016-02-12 MED ORDER — PIPERACILLIN-TAZOBACTAM 3.375 G IVPB 30 MIN: 3.3750 g | Freq: Once | INTRAVENOUS | Status: DC

## 2016-02-12 NOTE — Consult Note (Deleted)
Medical Consultation   Laura Velazquez  EAV:[id number]  DOB: 1990-10-03  DOA: 02/11/2016  PCP: None  Outpatient Specialists: None   Requesting physician: Dr. Preston FleetingGlick  Reason for consultation: Buttock abscess   History of Present Illness: Laura Velazquez is an 25 y.o. female no significant past medical history that comes in to the emergency department for sudden onset of constant worsening cyst on her tailbone that started 3 days ago. She relates been draining it happen after she shaved. The pain is 10 out of 10 she has noted some drainage. She's had these in the past and they have been indeed. She has been taking up pro-for the pain with mild relief. She noted that the day prior to admission and started draining and she started having fevers so she decided to come to the ED. She denies any nausea vomiting or associated septic symptoms.  In the ED: A CT was obtained that shows a superficial abscess with fistula to the exterior     Review of Systems:  ROS As per HPI otherwise 10 point review of systems negative.     Past Medical History: History reviewed. No pertinent past medical history.  No significant past medical history  Past Surgical History: History reviewed. No pertinent surgical history. ID a superficial abscess  Allergies:   Allergies  Allergen Reactions  . Vancomycin Itching     Social History:  reports that she has never smoked. She has never used smokeless tobacco. She reports that she does not drink alcohol or use drugs.   Family History: Family History  Problem Relation Age of Onset  . Diabetes Mellitus I Mother     Physical Exam: Vitals:   02/12/16 0230 02/12/16 0507 02/12/16 0530 02/12/16 0700  BP: 113/66 110/67 104/62 122/93  Pulse: 116 110 99   Resp:  18    Temp:      TempSrc:      SpO2: 100% 98% 98%   Weight:      Height:        Constitutional: ,  Alert and awake, oriented x3, not in any acute distress.Not  septic looking Eyes: PERLA, EOMI, irises appear normal, anicteric sclera,  ENMT: external ears and nose appear normal,            Lips appears normal, oropharynx mucosa, tongue, posterior pharynx appear normal  Neck: neck appears normal, no masses, normal ROM, no thyromegaly, no JVD  CVS: S1-S2 clear, no murmur rubs or gallops, no LE edema, normal pedal pulses  Respiratory:  clear to auscultation bilaterally, no wheezing, rales or rhonchi. Respiratory effort normal. No accessory muscle use.  Abdomen: soft nontender, nondistended, normal bowel sounds, no hepatosplenomegaly, no hernias  Musculoskeletal: : no cyanosis, clubbing or edema noted bilaterally                       Joint/bones/muscle exam, strength, contractures or atrophy Neuro: Cranial nerves II-XII intact, strength, sensation, reflexes Psych: judgement and insight appear normal, stable mood and affect, mental status Skin: Superficial induration with erythema and tender to touch with some mild drainage.  Data reviewed:  I have personally reviewed following labs and imaging studies Labs:  CBC:  Recent Labs Lab 02/12/16 0006  WBC 14.9*  NEUTROABS 11.7*  HGB 10.8*  HCT 33.8*  MCV 85.8  PLT 335    Basic Metabolic Panel:  Recent Labs Lab 02/12/16 0006  NA 137  K 3.7  CL 106  CO2 23  GLUCOSE 94  BUN 12  CREATININE 0.80  CALCIUM 9.2   GFR Estimated Creatinine Clearance: 89.8 mL/min (by C-G formula based on SCr of 0.8 mg/dL). Liver Function Tests:  Recent Labs Lab 02/12/16 0006  AST 17  ALT 16  ALKPHOS 40  BILITOT 0.4  PROT 8.1  ALBUMIN 4.3   No results for input(s): LIPASE, AMYLASE in the last 168 hours. No results for input(s): AMMONIA in the last 168 hours. Coagulation profile No results for input(s): INR, PROTIME in the last 168 hours.  Cardiac Enzymes: No results for input(s): CKTOTAL, CKMB, CKMBINDEX, TROPONINI in the last 168 hours. BNP: Invalid input(s): POCBNP CBG: No results for input(s):  GLUCAP in the last 168 hours. D-Dimer No results for input(s): DDIMER in the last 72 hours. Hgb A1c No results for input(s): HGBA1C in the last 72 hours. Lipid Profile No results for input(s): CHOL, HDL, LDLCALC, TRIG, CHOLHDL, LDLDIRECT in the last 72 hours. Thyroid function studies No results for input(s): TSH, T4TOTAL, T3FREE, THYROIDAB in the last 72 hours.  Invalid input(s): FREET3 Anemia work up No results for input(s): VITAMINB12, FOLATE, FERRITIN, TIBC, IRON, RETICCTPCT in the last 72 hours. Urinalysis    Component Value Date/Time   COLORURINE YELLOW 02/11/2016 0212   APPEARANCEUR CLEAR 02/11/2016 0212   LABSPEC 1.022 02/11/2016 0212   PHURINE 5.0 02/11/2016 0212   GLUCOSEU NEGATIVE 02/11/2016 0212   HGBUR NEGATIVE 02/11/2016 0212   BILIRUBINUR NEGATIVE 02/11/2016 0212   KETONESUR 15 (A) 02/11/2016 0212   PROTEINUR NEGATIVE 02/11/2016 0212   UROBILINOGEN 0.2 11/04/2014 0633   NITRITE NEGATIVE 02/11/2016 0212   LEUKOCYTESUR NEGATIVE 02/11/2016 0212     Microbiology No results found for this or any previous visit (from the past 240 hour(s)).     Inpatient Medications:   Scheduled Meds:  Continuous Infusions: . sodium chloride Stopped (02/12/16 0707)  . piperacillin-tazobactam (ZOSYN)  IV    . vancomycin       Radiological Exams on Admission: Ct Abdomen Pelvis W Contrast  Result Date: 02/12/2016 CLINICAL DATA:  Noticed a cyst on the tail bone 3 days ago. History of previous abscesses to this area. White cell count 14.9. Pain. EXAM: CT ABDOMEN AND PELVIS WITH CONTRAST TECHNIQUE: Multidetector CT imaging of the abdomen and pelvis was performed using the standard protocol following bolus administration of intravenous contrast. CONTRAST:  ISOVUE-300 IOPAMIDOL (ISOVUE-300) INJECTION 61% COMPARISON:  11/04/2014 FINDINGS: The law. The liver, spleen, gallbladder, pancreas, adrenal glands, kidneys, abdominal aorta, inferior vena cava, and retroperitoneal lymph nodes  are unremarkable. Stomach, small bowel, and colon are not abnormally distended. Stool fills the colon. No free air or free fluid in the abdomen. Pelvis: The cecum extends towards the left side. Appendix is located towards the left and appears normal. Uterus and ovaries are not enlarged. Bladder wall is not thickened. No free or loculated pelvic fluid collections. No pelvic mass or lymphadenopathy. No evidence of diverticulitis. The subcutaneous fat posterior to the tip of the coccyx, there is focal cavity or fistula to the skin with surrounding edema and stranding consistent with cellulitis and abscess. Inflammation extends to the left of the gluteal crease. The cavity is filled with hyperdense material possibly representing contrast or packing material.underlying bones appear intact. No bone erosion to suggest osteomyelitis. IMPRESSION: Inflammatory process in the subcutaneous fat posterior to the distal coccyx and extending to the left of the gluteal crease consistent with abscess and cellulitis with fistulous communication  to the skin. The cavity is filled with hyperdense material possibly representing packing material or contrast. Electronically Signed   By: Burman NievesWilliam  Stevens M.D.   On: 02/12/2016 06:06    Impression/Recommendations Active Problems:   Pilonidal abscess  CT scan of the abdomen and pelvis was done associated superficial abscess with fistula, she was given IV vancomycin and Zosyn she has a mild leukocytosis and fevers. She is not nauseated or vomiting infection is hungry. This could be treated with Bactrim orally for 14 days and ibuprofen for pain and fever as an outpatient. And be reevaluated if no improvement. The patient is also asking to be discharged home as she is able to take antibiotics. Lactic acid was less than 2 she is not tachycardic with a mild fever blood pressure is stable. According to the patient she does not have a history of MRSA, she is not immunocompromised we would be  reasonable to treat as an outpatient with oral antibiotic would recommend Bactrim 3 weeks.    Thank you for this consultation.  [facility] will follow the patient with you.   Time Spent: 70 min  Marinda ElkFELIZ ORTIZ, ABRAHAM M.D. [facility] 02/12/2016, 7:22 AM

## 2016-02-12 NOTE — Discharge Instructions (Signed)
You chose to go home rather than be admitted today. You are being given a prescription for antibiotics. Please take them as directed. You need to be seen in 2 days to have the packing removed and be reassessed. That can be done here, at another emergency department, or at an urgent care center. There is a risk that the infection will not be adequately controlled with what was done. Should you develop increased pain or fever, then return to be reevaluated.

## 2016-02-12 NOTE — Progress Notes (Signed)
Pharmacy Antibiotic Note  Laura Velazquez is a 25 y.o. female admitted on 02/11/2016 with sepsis 2/2 pilonidal abscess with fistula s/p I/D in ED on 8/18.  Pt had possible allergic reactions to vancomycin or zosyn (both administered at the same time) and unclear which antibiotic caused reaction.  MD ok with continuing Vancomycin for now with slower infusion and benadryl.  MD dosing CTX and flagyl as well.    Plan: Vancomycin 1g x1, then 1g IV q12hr (goal trough 10-15) CTX 1g IV q24h Flagyl 500mg  IV q8h -F/u how pt tolerates further vancomycin doses and will need to update allergy accordingly -F/u VT at Css as warranted, renal function, cultures, clinical course  Height: 5' (152.4 cm) Weight: 136 lb (61.7 kg) IBW/kg (Calculated) : 45.5  Temp (24hrs), Avg:101.2 F (38.4 C), Min:98.9 F (37.2 C), Max:103.1 F (39.5 C)   Recent Labs Lab 02/12/16 0006 02/12/16 0023 02/12/16 0308  WBC 14.9*  --   --   CREATININE 0.80  --   --   LATICACIDVEN  --  1.58 0.68    Estimated Creatinine Clearance: 89.8 mL/min (by C-G formula based on SCr of 0.8 mg/dL).    Allergies  Allergen Reactions  . Vancomycin Itching    Antimicrobials this admission: 8/18 >> Vanc >> 8/18 >> Zosyn >> 8/18 8/18 >> Ceftriaxone >> 8/18 >> Flagyl >>  Dose adjustments this admission:  Microbiology results: 8/18 BCx: collected 8/18 UCx:  collected 8/18 Wound cx: collected  Thank you for allowing pharmacy to be a part of this patient's care.  Haynes Hoehnolleen Iker Nuttall, PharmD, BCPS 02/12/2016, 10:27 AM  Pager: [phone]

## 2016-02-12 NOTE — ED Notes (Signed)
Patient ambulated to bathroom without assistance and drank 2 apple juices.

## 2016-02-12 NOTE — Progress Notes (Addendum)
Pharmacy Antibiotic Note  Laura RockersCarletta Velazquez is a 25 y.o. female admitted on 02/11/2016 with cellulitis.  Pharmacy has been consulted for Vancomycin and Zosyn  Dosing.  Patient noted to have itching after vancomycin infusion.  Will follow up with next RPh and verify that MD wishes to continue vancomycin.   Plan: Zosyn 3.375g IV q8h (4 hour infusion).  Vancomycin 1gm iv q12hr (goal trough 10-15)  Height: 5' (152.4 cm) Weight: 136 lb (61.7 kg) IBW/kg (Calculated) : 45.5  Temp (24hrs), Avg:102 F (38.9 C), Min:100.4 F (38 C), Max:103.1 F (39.5 C)   Recent Labs Lab 02/12/16 0006 02/12/16 0023 02/12/16 0308  WBC 14.9*  --   --   CREATININE 0.80  --   --   LATICACIDVEN  --  1.58 0.68    Estimated Creatinine Clearance: 89.8 mL/min (by C-G formula based on SCr of 0.8 mg/dL).    Allergies  Allergen Reactions  . Vancomycin Itching    Antimicrobials this admission: Vancomycin 02/12/2016 >> Zosyn 02/12/2016 >>   Dose adjustments this admission: -  Microbiology results: Pending  Thank you for allowing pharmacy to be a part of this patient's care.  Aleene DavidsonGrimsley Jr, Raed Schalk Crowford 02/12/2016 4:40 AM

## 2016-02-12 NOTE — ED Notes (Signed)
Staff attempted to start IV for meds, unsuccessful, to consult IV Team.

## 2016-02-12 NOTE — ED Notes (Signed)
Pt can go up at 09:14.

## 2016-02-12 NOTE — ED Notes (Signed)
PT had already went to the bathroom and didn't know a sample was needed. Stated she will attempt in a few.

## 2016-02-12 NOTE — H&P (Signed)
History and Physical    Laura Velazquez [id number] DOB: Oct 25, 1990 DOA: 02/11/2016  PCP: No PCP Per Patient none Patient coming from: home  Chief Complaint: buttock pain  HPI: Laura Velazquez is a 25 y.o. female with no significant past medical history that comes in to the emergency department for sudden onset of constant worsening cyst on her tailbone that started 3 days ago. She relates been draining it happen after she shaved. The pain is 10 out of 10 she has noted some drainage. She's had these in the past and they have been indeed. She has been taking up pro-for the pain with mild relief. She noted that the day prior to admission and started draining and she started having fevers so she decided to come to the ED. She denies any nausea vomiting or associated septic symptoms  ED Course: A CT was obtained that shows a superficial abscess with fistula to the exterior, as her blood pressure trended down and became slightly tachycardic it was discussed with the ED to admit the patient and start her on empiric antibiotics.  Review of Systems: As per HPI otherwise 10 point review of systems negative.    History reviewed. No pertinent past medical history.  History reviewed. No pertinent surgical history.   reports that she has never smoked. She has never used smokeless tobacco. She reports that she does not drink alcohol or use drugs.  Allergies  Allergen Reactions  . Vancomycin Itching    Family History  Problem Relation Age of Onset  . Diabetes Mellitus I Mother      Prior to Admission medications   Medication Sig Start Date End Date Taking? Authorizing Provider  ibuprofen (ADVIL,MOTRIN) 200 MG tablet Take 400 mg by mouth every 6 (six) hours as needed for moderate pain.   Yes Historical Provider, MD  clindamycin (CLEOCIN) 300 MG capsule Take 1 capsule (300 mg total) by mouth 4 (four) times daily. X 7 days 02/12/16   Dione Boozeavid Glick, MD  oxyCODONE-acetaminophen (PERCOCET)  5-325 MG tablet Take 1 tablet by mouth every 4 (four) hours as needed for moderate pain. 02/12/16   Dione Boozeavid Glick, MD    Physical Exam: Vitals:   02/12/16 0530 02/12/16 0700 02/12/16 0824 02/12/16 0833  BP: 104/62 122/93 (!) 98/51 100/72  Pulse: 99  (S) (!) 124 (!) 130  Resp:   22   Temp:   98.9 F (37.2 C)   TempSrc:   Oral   SpO2: 98%  100%   Weight:      Height:          Constitutional: NAD, calm, comfortable Vitals:   02/12/16 0530 02/12/16 0700 02/12/16 0824 02/12/16 0833  BP: 104/62 122/93 (!) 98/51 100/72  Pulse: 99  (S) (!) 124 (!) 130  Resp:   22   Temp:   98.9 F (37.2 C)   TempSrc:   Oral   SpO2: 98%  100%   Weight:      Height:       Eyes: PERRL, lids and conjunctivae normal ENMT: Mucous membranes are moist. Posterior pharynx clear of any exudate or lesions.Normal dentition.  Neck: normal, supple, no masses, no thyromegaly Respiratory: clear to auscultation bilaterally, no wheezing, no crackles. Normal respiratory effort. No accessory muscle use.  Cardiovascular: Regular rate and rhythm, no murmurs / rubs / gallops. No extremity edema. 2+ pedal pulses. No carotid bruits.  Abdomen: no tenderness, no masses palpated. No hepatosplenomegaly. Bowel sounds positive.  Musculoskeletal: no clubbing / cyanosis. No joint  deformity upper and lower extremities. Good ROM, no contractures. Normal muscle tone.  Skin: Superficial induration with erythema and tender to touch with some mild drainage. Neurologic: CN 2-12 grossly intact. Sensation intact, DTR normal. Strength 5/5 in all 4.  Psychiatric: Normal judgment and insight. Alert and oriented x 3. Normal mood.    Labs on Admission: I have personally reviewed following labs and imaging studies  CBC:  Recent Labs Lab 02/12/16 0006  WBC 14.9*  NEUTROABS 11.7*  HGB 10.8*  HCT 33.8*  MCV 85.8  PLT 335   Basic Metabolic Panel:  Recent Labs Lab 02/12/16 0006  NA 137  K 3.7  CL 106  CO2 23  GLUCOSE 94  BUN 12    CREATININE 0.80  CALCIUM 9.2   GFR: Estimated Creatinine Clearance: 89.8 mL/min (by C-G formula based on SCr of 0.8 mg/dL). Liver Function Tests:  Recent Labs Lab 02/12/16 0006  AST 17  ALT 16  ALKPHOS 40  BILITOT 0.4  PROT 8.1  ALBUMIN 4.3   No results for input(s): LIPASE, AMYLASE in the last 168 hours. No results for input(s): AMMONIA in the last 168 hours. Coagulation Profile: No results for input(s): INR, PROTIME in the last 168 hours. Cardiac Enzymes: No results for input(s): CKTOTAL, CKMB, CKMBINDEX, TROPONINI in the last 168 hours. BNP (last 3 results) No results for input(s): PROBNP in the last 8760 hours. HbA1C: No results for input(s): HGBA1C in the last 72 hours. CBG: No results for input(s): GLUCAP in the last 168 hours. Lipid Profile: No results for input(s): CHOL, HDL, LDLCALC, TRIG, CHOLHDL, LDLDIRECT in the last 72 hours. Thyroid Function Tests: No results for input(s): TSH, T4TOTAL, FREET4, T3FREE, THYROIDAB in the last 72 hours. Anemia Panel: No results for input(s): VITAMINB12, FOLATE, FERRITIN, TIBC, IRON, RETICCTPCT in the last 72 hours. Urine analysis:    Component Value Date/Time   COLORURINE YELLOW 02/11/2016 0212   APPEARANCEUR CLEAR 02/11/2016 0212   LABSPEC 1.022 02/11/2016 0212   PHURINE 5.0 02/11/2016 0212   GLUCOSEU NEGATIVE 02/11/2016 0212   HGBUR NEGATIVE 02/11/2016 0212   BILIRUBINUR NEGATIVE 02/11/2016 0212   KETONESUR 15 (A) 02/11/2016 0212   PROTEINUR NEGATIVE 02/11/2016 0212   UROBILINOGEN 0.2 11/04/2014 0633   NITRITE NEGATIVE 02/11/2016 0212   LEUKOCYTESUR NEGATIVE 02/11/2016 0212   Sepsis Labs: !!!!!!!!!!!!!!!!!!!!!!!!!!!!!!!!!!!!!!!!!!!! @LABRCNTIP (procalcitonin:4,lacticidven:4) )No results found for this or any previous visit (from the past 240 hour(s)).   Radiological Exams on Admission: Ct Abdomen Pelvis W Contrast  Result Date: 02/12/2016 CLINICAL DATA:  Noticed a cyst on the tail bone 3 days ago. History of  previous abscesses to this area. White cell count 14.9. Pain. EXAM: CT ABDOMEN AND PELVIS WITH CONTRAST TECHNIQUE: Multidetector CT imaging of the abdomen and pelvis was performed using the standard protocol following bolus administration of intravenous contrast. CONTRAST:  ISOVUE-300 IOPAMIDOL (ISOVUE-300) INJECTION 61% COMPARISON:  11/04/2014 FINDINGS: The law. The liver, spleen, gallbladder, pancreas, adrenal glands, kidneys, abdominal aorta, inferior vena cava, and retroperitoneal lymph nodes are unremarkable. Stomach, small bowel, and colon are not abnormally distended. Stool fills the colon. No free air or free fluid in the abdomen. Pelvis: The cecum extends towards the left side. Appendix is located towards the left and appears normal. Uterus and ovaries are not enlarged. Bladder wall is not thickened. No free or loculated pelvic fluid collections. No pelvic mass or lymphadenopathy. No evidence of diverticulitis. The subcutaneous fat posterior to the tip of the coccyx, there is focal cavity or fistula to the  skin with surrounding edema and stranding consistent with cellulitis and abscess. Inflammation extends to the left of the gluteal crease. The cavity is filled with hyperdense material possibly representing contrast or packing material.underlying bones appear intact. No bone erosion to suggest osteomyelitis. IMPRESSION: Inflammatory process in the subcutaneous fat posterior to the distal coccyx and extending to the left of the gluteal crease consistent with abscess and cellulitis with fistulous communication to the skin. The cavity is filled with hyperdense material possibly representing packing material or contrast. Electronically Signed   By: Burman NievesWilliam  Stevens M.D.   On: 02/12/2016 06:06    EKG: Independently reviewed. none  Assessment/Plan Active Problems:   Pilonidal abscess   Cellulitis and abscess   Tachycardia   Fever and chills CT scan of the abdomen and pelvis was done associated  superficial abscess with fistula, she was given IV vancomycin and Zosyn she has a mild leukocytosis and fevers.  Start her on sepsis protocol, completing initial IV fluid hydration. Abscess has been I&Dby the ED physician. She became mildly tachycardic and blood pressure is soft, so we'll admit her for 48 hours IV empiric antibiotics IV vancomycin and Rocephin. She is not nauseated or vomiting infection is hungry.  Lactic acid was less than 2  she has been having mild fevers. Follow up on blood cultures. According to the patient she does not have a history of MRSA, she is not immunocompromised.    DVT prophylaxis: lovenox Code Status: full Family Communication: none  Disposition Plan: inpatient for 2-3 days Consults called: none Admission status: inpatient   Marinda ElkFELIZ ORTIZ, Jaydon Soroka MD [facility] Pager [phone]  If 7PM-7AM, please contact night-coverage [email or web address] Password [email or web address]  02/12/2016, 8:45 AM

## 2016-02-12 NOTE — ED Notes (Signed)
Pt reports that after receiving Vanc she developed itching to her inner thighs and forehead.  Pt also stated that she felt a little, "whoozy" after standing however it passed quickly.  Denies any other sx associated w/ receiving vancomycin.  Dahlia ClientHannah, GeorgiaPA and JC from pharmacy notified.

## 2016-02-13 DIAGNOSIS — L0390 Cellulitis, unspecified: Secondary | ICD-10-CM

## 2016-02-13 DIAGNOSIS — R509 Fever, unspecified: Secondary | ICD-10-CM

## 2016-02-13 DIAGNOSIS — L0291 Cutaneous abscess, unspecified: Secondary | ICD-10-CM

## 2016-02-13 LAB — URINE CULTURE

## 2016-02-13 LAB — BASIC METABOLIC PANEL
ANION GAP: 9 (ref 5–15)
BUN: 15 mg/dL (ref 6–20)
CALCIUM: 8.7 mg/dL — AB (ref 8.9–10.3)
CO2: 22 mmol/L (ref 22–32)
CREATININE: 0.64 mg/dL (ref 0.44–1.00)
Chloride: 106 mmol/L (ref 101–111)
GFR calc Af Amer: >60 mL/min (ref >60)
GLUCOSE: 156 mg/dL — AB (ref 65–99)
Potassium: 3.4 mmol/L — ABNORMAL LOW (ref 3.5–5.1)
Sodium: 137 mmol/L (ref 135–145)

## 2016-02-13 LAB — CBC
HCT: 31 % — ABNORMAL LOW (ref 36.0–46.0)
HEMOGLOBIN: 9.7 g/dL — AB (ref 12.0–15.0)
MCH: 26.8 pg (ref 26.0–34.0)
MCHC: 31.3 g/dL (ref 30.0–36.0)
MCV: 85.6 fL (ref 78.0–100.0)
PLATELETS: 318 10*3/uL (ref 150–400)
RBC: 3.62 MIL/uL — ABNORMAL LOW (ref 3.87–5.11)
RDW: 13.5 % (ref 11.5–15.5)
WBC: 9.4 10*3/uL (ref 4.0–10.5)

## 2016-02-13 NOTE — Progress Notes (Signed)
Talking with pt found she did not have packing the earlier time, when she was not admitted and sent home on po abx. She was not aware she had packing in her wound this time rather than simply drained like before. Dr Cena BentonVega notified. Reatha Sur, [employer] Bath [employer] Beyondaylor

## 2016-02-13 NOTE — Progress Notes (Signed)
PROGRESS NOTE    Laura Velazquez  WUJ:[id number] DOB: [birth year] DOA: 02/11/2016 PCP: No PCP Per Patient   Brief Narrative:  Pt presented with pilonidal abscess was I and D in the ED  Assessment & Plan:     Sepsis affecting skin/Pilonidal abscess/Cellulitis and abscess - Still has tachycardia and recently had fever. I recommend patient has continued IV antibiotics and stay for further monitoring   DVT prophylaxis: Lovenox Code Status: Full Family Communication:  Disposition Plan: pending improvement in condition   Consultants:   None   Procedures: I and D in ED   Antimicrobials: Ceftriaxone, metronidazole, vancomycin   Subjective: Pt has no new complaints, no acute issues overnight.  Objective: Vitals:   02/12/16 1419 02/12/16 2125 02/13/16 0546 02/13/16 1349  BP: (!) 118/56 116/65 (!) 94/50 102/72  Pulse: (!) 111 99 97 (!) 118  Resp: 16 14 14 16   Temp: 98.6 F (37 C) 98.9 F (37.2 C) 98.2 F (36.8 C) 99.2 F (37.3 C)  TempSrc: Oral Oral Oral Oral  SpO2: 100% 100% 100% 100%  Weight:      Height:        Intake/Output Summary (Last 24 hours) at 02/13/16 1440 Last data filed at 02/13/16 1350  Gross per 24 hour  Intake             1160 ml  Output                0 ml  Net             1160 ml   Filed Weights   02/11/16 2034  Weight: 61.7 kg (136 lb)    Examination:  General exam: Appears calm and comfortable  Respiratory system: no wheezes, no rhales, equal chest rise Cardiovascular system: no cyanosis Gastrointestinal system: non distended, no guarding Central nervous system: Alert and oriented. No focal neurological deficits. Extremities: Symmetric 5 x 5 power. Psychiatry: Judgement and insight appear normal. Mood & affect appropriate.   Data Reviewed: I have personally reviewed following labs and imaging studies  CBC:  Recent Labs Lab 02/12/16 0006 02/12/16 1002 02/13/16 0416  WBC 14.9* 15.3* 9.4  NEUTROABS 11.7*  --   --   HGB  10.8* 9.8* 9.7*  HCT 33.8* 31.1* 31.0*  MCV 85.8 86.9 85.6  PLT 335 314 318   Basic Metabolic Panel:  Recent Labs Lab 02/12/16 0006 02/12/16 1002 02/13/16 0416  NA 137  --  137  K 3.7  --  3.4*  CL 106  --  106  CO2 23  --  22  GLUCOSE 94  --  156*  BUN 12  --  15  CREATININE 0.80 0.81 0.64  CALCIUM 9.2  --  8.7*   GFR: Estimated Creatinine Clearance: 89.8 mL/min (by C-G formula based on SCr of 0.8 mg/dL). Liver Function Tests:  Recent Labs Lab 02/12/16 0006  AST 17  ALT 16  ALKPHOS 40  BILITOT 0.4  PROT 8.1  ALBUMIN 4.3   No results for input(s): LIPASE, AMYLASE in the last 168 hours. No results for input(s): AMMONIA in the last 168 hours. Coagulation Profile:  Recent Labs Lab 02/12/16 1002  INR 1.16   Cardiac Enzymes: No results for input(s): CKTOTAL, CKMB, CKMBINDEX, TROPONINI in the last 168 hours. BNP (last 3 results) No results for input(s): PROBNP in the last 8760 hours. HbA1C: No results for input(s): HGBA1C in the last 72 hours. CBG: No results for input(s): GLUCAP in the last 168 hours. Lipid  Profile: No results for input(s): CHOL, HDL, LDLCALC, TRIG, CHOLHDL, LDLDIRECT in the last 72 hours. Thyroid Function Tests: No results for input(s): TSH, T4TOTAL, FREET4, T3FREE, THYROIDAB in the last 72 hours. Anemia Panel: No results for input(s): VITAMINB12, FOLATE, FERRITIN, TIBC, IRON, RETICCTPCT in the last 72 hours. Sepsis Labs:  Recent Labs Lab 02/12/16 0023 02/12/16 0308  LATICACIDVEN 1.58 0.68    Recent Results (from the past 240 hour(s))  Urine culture     Status: Abnormal   Collection Time: 02/11/16  2:12 AM  Result Value Ref Range Status   Specimen Description URINE, CLEAN CATCH  Final   Special Requests NONE  Final   Culture (A)  Final    <10,000 COLONIES/mL INSIGNIFICANT GROWTH Performed at [facility]    Report Status 02/13/2016 FINAL  Final  Blood Culture (routine x 2)     Status: None (Preliminary result)    Collection Time: 02/12/16 12:06 AM  Result Value Ref Range Status   Specimen Description BLOOD RIGHT ANTECUBITAL  Final   Special Requests BOTTLES DRAWN AEROBIC AND ANAEROBIC 5CC  Final   Culture   Final    NO GROWTH 1 DAY [facility]formed at [facility]    Report Status PENDING  Incomplete  Blood Culture (routine x 2)     Status: None (Preliminary result)   Collection Time: 02/12/16  1:30 AM  Result Value Ref Range Status   Specimen Description BLOOD RIGHT ANTECUBITAL  Final   Special Requests IN PEDIATRIC BOTTLE 3CC  Final   Culture   Final    NO GROWTH 1 DAY Performed at [facility]    Report Status PENDING  Incomplete  Wound or Superficial Culture     Status: None (Preliminary result)   Collection Time: 02/12/16  7:13 AM  Result Value Ref Range Status   Specimen Description ABSCESS MEDIAL BUTTOCKS  Final   Special Requests NONE  Final   Gram Stain   Final    FEW WBC PRESENT, PREDOMINANTLY PMN RARE GRAM POSITIVE COCCI IN PAIRS RARE GRAM NEGATIVE RODS [facility]    Culture PENDING  Incomplete   Report Status PENDING  Incomplete         Radiology Studies: Ct Abdomen Pelvis W Contrast  Result Date: 02/12/2016 CLINICAL DATA:  Noticed a cyst on the tail bone 3 days ago. History of previous abscesses to this area. White cell count 14.9. Pain. EXAM: CT ABDOMEN AND PELVIS WITH CONTRAST TECHNIQUE: Multidetector CT imaging of the abdomen and pelvis was performed using the standard protocol following bolus administration of intravenous contrast. CONTRAST:  100mL ISOVUE-300 IOPAMIDOL (ISOVUE-300) INJECTION 61% COMPARISON:  11/04/2014 FINDINGS: The law. The liver, spleen, gallbladder, pancreas, adrenal glands, kidneys, abdominal aorta, inferior vena cava, and retroperitoneal lymph nodes are unremarkable. Stomach, small bowel, and colon are not abnormally distended. Stool fills the colon. No free air or free fluid in the abdomen. Pelvis: The cecum extends  towards the left side. Appendix is located towards the left and appears normal. Uterus and ovaries are not enlarged. Bladder wall is not thickened. No free or loculated pelvic fluid collections. No pelvic mass or lymphadenopathy. No evidence of diverticulitis. The subcutaneous fat posterior to the tip of the coccyx, there is focal cavity or fistula to the skin with surrounding edema and stranding consistent with cellulitis and abscess. Inflammation extends to the left of the gluteal crease. The cavity is filled with hyperdense material possibly representing contrast or packing material.underlying bones appear  intact. No bone erosion to suggest osteomyelitis. IMPRESSION: Inflammatory process in the subcutaneous fat posterior to the distal coccyx and extending to the left of the gluteal crease consistent with abscess and cellulitis with fistulous communication to the skin. The cavity is filled with hyperdense material possibly representing packing material or contrast. Electronically Signed   By: Burman Nieves M.D.   On: 02/12/2016 06:06    Scheduled Meds: . cefTRIAXone (ROCEPHIN)  IV  1 g Intravenous Q24H  . diphenhydrAMINE  25-50 mg Oral Q12H  . enoxaparin (LOVENOX) injection  40 mg Subcutaneous Q24H  . metronidazole  500 mg Intravenous Q8H  . vancomycin  1,000 mg Intravenous Q12H   Continuous Infusions:    LOS: 1 day   Time spent: > 20 minutes  Penny Pia, MD [facility] Pager [phone]  If 7PM-7AM, please contact night-coverage [email or web address] Password [email or web address] 02/13/2016, 2:40 PM

## 2016-02-13 NOTE — Care Management Note (Signed)
Case Management Note  Patient Details  Name: Laura Velazquez MRN: [id number] Date of Birth: 1991-02-27  Subjective/Objective:   Sepsis affecting skin, pilonidal abscell, cellulits                   Action/Plan: Discharge Planning:  NCM spoke to pt and states she has BCBS. 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 She believes she has insurance card at home. Her mother is currently out of the country. Pt states [facility] has her insurance information on file so she can get her medications. She does not have PCP at this time. NCM reviewed with pt how to check BCBS website or contact toll free number on her card to speak to rep for a provider list. They will give a list of names when she call to call to schedule follow appt. Explained to pt if she sign AMA, explained she may forfeit insurance covering hospital bill. Spoke to attending, plan is for pt to stay to continue to receive IV abx. Not stable for dc.   1620 NCM did get the opportunity to speak with pt and she is going to stay until medically stable for dc.   Expected Discharge Date:               Expected Discharge Plan:  Home/Self Care  In-House Referral:  NA  Discharge planning Services  CM Consult  Post Acute Care Choice:  NA Choice offered to:  NA  DME Arranged:  N/A DME Agency:  NA  HH Arranged:  NA HH Agency:  NA  Status of Service:  Completed, signed off  If discussed at Long Length of Stay Meetings, dates discussed:    Additional Comments:  Laura Velazquez, Laura Southard Ellen, RN 02/13/2016, 4:17 PM

## 2016-02-14 LAB — AEROBIC CULTURE  (SUPERFICIAL SPECIMEN)

## 2016-02-14 MED ORDER — MELOXICAM 7.5 MG PO TABS: 7.5000 mg | ORAL_TABLET | Freq: Every day | ORAL | 0 refills | Status: AC

## 2016-02-14 MED ORDER — SULFAMETHOXAZOLE-TRIMETHOPRIM 800-160 MG PO TABS: 1.0000 | ORAL_TABLET | Freq: Two times a day (BID) | ORAL | 0 refills | Status: AC

## 2016-02-14 NOTE — Discharge Summary (Addendum)
Physician Discharge Summary  Laura Velazquez WUJ:[id number] DOB: [birth year] DOA: 02/11/2016  PCP: No PCP Per Patient  Admit date: 02/11/2016 Discharge date: 02/14/2016  Time spent: > 35 minutes  Recommendations for Outpatient Follow-up:  1. Patient recently had pilonidal abscess drained in the emergency department. Presented with sepsis physiology. Improved after 2 days of IV antibiotics and will boost discharge on oral antibiotics for 7 more days to complete a treatment course for 10 days total.   Discharge Diagnoses:  Active Problems:   Pilonidal abscess   Cellulitis and abscess   Tachycardia   Fever and chills   Sepsis affecting skin   Discharge Condition: stable  Diet recommendation: regular diet  Filed Weights   02/11/16 2034  Weight: 61.7 kg (136 lb)    History of present illness:  25 y.o. female with no significant past medical history that comes in to the emergency department for sudden onset of constant worsening cyst on her tailbone that started 3 days ago. She relates been draining it happen after she shaved. The pain is 10 out of 10 she has noted some drainage. Admitted after pilonidal abscess was drained.  Hospital Course:  Sepsis - Sepsis physiology resolved after several days of IV broad spectrum antibiotic.  Pilonidal cyst abscess  - I&D in the emergency department - Improved on IV antibiotics Rocephin, metronidazole, vancomycin - Will be discharged on Bactrim and patient has been instructed to take Tylenol and/or ibuprofen for discomfort  Procedures:  None  Consultations:  None  Discharge Exam: Vitals:   02/13/16 2224 02/14/16 0535  BP: 121/61 108/67  Pulse: (!) 114 85  Resp: 16 16  Temp: 100.3 F (37.9 C) 97.7 F (36.5 C)    General: Patient in no acute distress, alert and awake Cardiovascular: Regular rate and rhythm, no murmurs or rubs Respiratory: No increased work of breathing or wheezes  Discharge Instructions   Discharge  Instructions    Call MD for:  extreme fatigue    Complete by:  As directed   Call MD for:  severe uncontrolled pain    Complete by:  As directed   Call MD for:  temperature >100.4    Complete by:  As directed   Diet - low sodium heart healthy    Complete by:  As directed   Discharge instructions    Complete by:  As directed   Please follow-up with your primary care physician or general surgery for further evaluation recommendations after hospital discharge.   Increase activity slowly    Complete by:  As directed     Current Discharge Medication List    START taking these medications   Details  meloxicam (MOBIC) 7.5 MG tablet Take 1 tablet (7.5 mg total) by mouth daily. Qty: 15 tablet, Refills: 0    sulfamethoxazole-trimethoprim (BACTRIM DS,SEPTRA DS) 800-160 MG tablet Take 1 tablet by mouth 2 (two) times daily. Qty: 14 tablet, Refills: 0      CONTINUE these medications which have NOT CHANGED   Details  ibuprofen (ADVIL,MOTRIN) 200 MG tablet Take 400 mg by mouth every 6 (six) hours as needed for moderate pain.       Allergies  Allergen Reactions  . Vancomycin Itching    Patient experienced itching following 1st doses of vancomycin and Zosyn on 02/12/16. Patient able to tolerate subsequent Vancomycin doses over 2 hour infusion with Benadryl without issue.   Follow-up Information    [facility]   Contact information: [phone]  The results of significant diagnostics from this hospitalization (including imaging, microbiology, ancillary and laboratory) are listed below for reference.    Significant Diagnostic Studies: Ct Abdomen Pelvis W Contrast  Result Date: 02/12/2016 CLINICAL DATA:  Noticed a cyst on the tail bone 3 days ago. History of previous abscesses to this area. White cell count 14.9. Pain. EXAM: CT ABDOMEN AND PELVIS WITH CONTRAST TECHNIQUE: Multidetector CT imaging of the abdomen and pelvis was performed using the standard protocol following  bolus administration of intravenous contrast. CONTRAST:  100mL ISOVUE-300 IOPAMIDOL (ISOVUE-300) INJECTION 61% COMPARISON:  11/04/2014 FINDINGS: The law. The liver, spleen, gallbladder, pancreas, adrenal glands, kidneys, abdominal aorta, inferior vena cava, and retroperitoneal lymph nodes are unremarkable. Stomach, small bowel, and colon are not abnormally distended. Stool fills the colon. No free air or free fluid in the abdomen. Pelvis: The cecum extends towards the left side. Appendix is located towards the left and appears normal. Uterus and ovaries are not enlarged. Bladder wall is not thickened. No free or loculated pelvic fluid collections. No pelvic mass or lymphadenopathy. No evidence of diverticulitis. The subcutaneous fat posterior to the tip of the coccyx, there is focal cavity or fistula to the skin with surrounding edema and stranding consistent with cellulitis and abscess. Inflammation extends to the left of the gluteal crease. The cavity is filled with hyperdense material possibly representing contrast or packing material.underlying bones appear intact. No bone erosion to suggest osteomyelitis. IMPRESSION: Inflammatory process in the subcutaneous fat posterior to the distal coccyx and extending to the left of the gluteal crease consistent with abscess and cellulitis with fistulous communication to the skin. The cavity is filled with hyperdense material possibly representing packing material or contrast. Electronically Signed   By: Burman NievesWilliam  Stevens M.D.   On: 02/12/2016 06:06    Microbiology: Recent Results (from the past 240 hour(s))  Urine culture     Status: Abnormal   Collection Time: 02/11/16  2:12 AM  Result Value Ref Range Status   Specimen Description URINE, CLEAN CATCH  Final   Special Requests NONE  Final   Culture (A)  Final    <10,000 COLONIES/mL INSIGNIFICANT GROWTH Performed at [facility]    Report Status 02/13/2016 FINAL  Final  Blood Culture (routine x 2)      Status: None (Preliminary result)   Collection Time: 02/12/16 12:06 AM  Result Value Ref Range Status   Specimen Description BLOOD RIGHT ANTECUBITAL  Final   Special Requests BOTTLES DRAWN AEROBIC AND ANAEROBIC 5CC  Final   Culture   Final    NO GROWTH 1 DAY Performed at [facility]    Report Status PENDING  Incomplete  Blood Culture (routine x 2)     Status: None (Preliminary result)   Collection Time: 02/12/16  1:30 AM  Result Value Ref Range Status   Specimen Description BLOOD RIGHT ANTECUBITAL  Final   Special Requests IN PEDIATRIC BOTTLE 3CC  Final   Culture   Final    NO GROWTH 1 DAY Performed at [facility]    Report Status PENDING  Incomplete  Wound or Superficial Culture     Status: None (Preliminary result)   Collection Time: 02/12/16  7:13 AM  Result Value Ref Range Status   Specimen Description ABSCESS MEDIAL BUTTOCKS  Final   Special Requests NONE  Final   Gram Stain   Final    FEW WBC PRESENT, PREDOMINANTLY PMN RARE GRAM POSITIVE COCCI IN PAIRS RARE GRAM NEGATIVE RODS  Culture   Final    FEW GROUP B STREP(S.AGALACTIAE)ISOLATED TESTING AGAINST S. AGALACTIAE NOT ROUTINELY PERFORMED DUE TO PREDICTABILITY OF AMP/PEN/VAN SUSCEPTIBILITY. Performed at [facility]    Report Status PENDING  Incomplete     Labs: Basic Metabolic Panel:  Recent Labs Lab 02/12/16 0006 02/12/16 1002 02/13/16 0416  NA 137  --  137  K 3.7  --  3.4*  CL 106  --  106  CO2 23  --  22  GLUCOSE 94  --  156*  BUN 12  --  15  CREATININE 0.80 0.81 0.64  CALCIUM 9.2  --  8.7*   Liver Function Tests:  Recent Labs Lab 02/12/16 0006  AST 17  ALT 16  ALKPHOS 40  BILITOT 0.4  PROT 8.1  ALBUMIN 4.3   No results for input(s): LIPASE, AMYLASE in the last 168 hours. No results for input(s): AMMONIA in the last 168 hours. CBC:  Recent Labs Lab 02/12/16 0006 02/12/16 1002 02/13/16 0416  WBC 14.9* 15.3* 9.4  NEUTROABS 11.7*  --   --   HGB 10.8* 9.8*  9.7*  HCT 33.8* 31.1* 31.0*  MCV 85.8 86.9 85.6  PLT 335 314 318   Cardiac Enzymes: No results for input(s): CKTOTAL, CKMB, CKMBINDEX, TROPONINI in the last 168 hours. BNP: BNP (last 3 results) No results for input(s): BNP in the last 8760 hours.  ProBNP (last 3 results) No results for input(s): PROBNP in the last 8760 hours.  CBG: No results for input(s): GLUCAP in the last 168 hours.  Signed:  Penny Pia MD.  [facility] 02/14/2016, 1:47 PM

## 2016-02-14 NOTE — Progress Notes (Signed)
Discharged from floor ambulatory, to return home by personal car, belongings with pt. No changes in assessment. Jonaven Hilgers, [employer]

## 2016-02-16 ENCOUNTER — Ambulatory Visit (HOSPITAL_COMMUNITY)
Admission: EM | Admit: 2016-02-16 | Discharge: 2016-02-16 | Disposition: A | Discharge status: Home / Self Care | Payer: Self-pay | Attending: Emergency Medicine | Admitting: Emergency Medicine

## 2016-02-16 ENCOUNTER — Encounter (HOSPITAL_COMMUNITY): Payer: Self-pay | Admitting: Emergency Medicine

## 2016-02-16 DIAGNOSIS — B3731 Acute candidiasis of vulva and vagina: Secondary | ICD-10-CM

## 2016-02-16 DIAGNOSIS — Z4801 Encounter for change or removal of surgical wound dressing: Secondary | ICD-10-CM

## 2016-02-16 DIAGNOSIS — Z5189 Encounter for other specified aftercare: Secondary | ICD-10-CM

## 2016-02-16 DIAGNOSIS — B373 Candidiasis of vulva and vagina: Secondary | ICD-10-CM

## 2016-02-16 MED ORDER — FLUCONAZOLE 150 MG PO TABS: ORAL_TABLET | ORAL | 0 refills | Status: AC

## 2016-02-16 NOTE — ED Triage Notes (Signed)
Pt was hospitalized for an infected cyst on her coccyx.  Pt was on IV antibiotics and has developed a yeast infection.  She is here today to treat the yeast and to have the packing removed and the wound assessed.  Pt has not been taking her abx at home due to insurance issues but is planning on picking them up today.

## 2016-02-16 NOTE — ED Notes (Signed)
Wound care and dressing done by Hayden Rasmussenavid Mabe, NP.

## 2016-02-16 NOTE — ED Provider Notes (Signed)
CSN: [id number]     Arrival date & time 02/16/16  1030 History   First MD Initiated Contact with Patient 02/16/16 1134     Chief Complaint  Patient presents with  . packing removal  . Vaginitis   (Consider location/radiation/quality/duration/timing/severity/associated sxs/prior Treatment) 25 year old female presents to the urgent care to have a wound check and packing removal after she had presented to the emergency department 5 days ago for an abscess within the posterior most gluteal cleft. She states that this was associated with a fever. The abscess was incised and drained and then she was admitted to the hospital for 2 days. She received IV antibiotics. She states as a result she has obtained a yeast infection with primarily redness and irritation to the vulvovaginal and medial area. She was prescribed an antibiotic but has not filled it. She states she plans on doing that today.      History reviewed. No pertinent past medical history. History reviewed. No pertinent surgical history. Family History  Problem Relation Age of Onset  . Diabetes Mellitus I Mother    Social History  Substance Use Topics  . Smoking status: Never Smoker  . Smokeless tobacco: Never Used  . Alcohol use No     Comment: socially   OB History    No data available     Review of Systems  Constitutional: Negative for activity change and fever.  HENT: Negative.   Eyes: Negative.   Respiratory: Negative.   Gastrointestinal: Negative.   Genitourinary: Negative.   Skin: Positive for wound.  Neurological: Negative.   All other systems reviewed and are negative.   Allergies  Vancomycin  Home Medications   Prior to Admission medications   Medication Sig Start Date End Date Taking? Authorizing Provider  ibuprofen (ADVIL,MOTRIN) 200 MG tablet Take 400 mg by mouth every 6 (six) hours as needed for moderate pain.    Historical Provider, MD  meloxicam (MOBIC) 7.5 MG tablet Take 1 tablet (7.5 mg total)  by mouth daily. 02/14/16   Penny Piarlando Vega, MD  sulfamethoxazole-trimethoprim (BACTRIM DS,SEPTRA DS) 800-160 MG tablet Take 1 tablet by mouth 2 (two) times daily. 02/14/16 02/21/16  Penny Piarlando Vega, MD   Meds Ordered and Administered this Visit  Medications - No data to display  BP 126/81 (BP Location: Left Arm)   Pulse 83   Temp 99.1 F (37.3 C) (Oral)   LMP 02/06/2016 (Exact Date) Comment: negative urine pregnancy test 02/12/16  SpO2 100%  No data found.   Physical Exam  Constitutional: She is oriented to person, place, and time. She appears well-developed and well-nourished. No distress.  HENT:  Head: Normocephalic and atraumatic.  Neck: Neck supple.  Cardiovascular: Normal rate.   Pulmonary/Chest: Effort normal.  Musculoskeletal: Normal range of motion.  Neurological: She is alert and oriented to person, place, and time.  Skin: Skin is warm and dry.  The abscess area and which an I&D was performed is located in the posterior most gluteal cleft near the coccyx. The packing is intact. This was removed. Examination reveals no or drainage. Tissue was pink and healing. No surrounding erythema or cellulitis.  Psychiatric: She has a normal mood and affect.  Nursing note and vitals reviewed.   Urgent Care Course   Clinical Course    Procedures (including critical care time)  Labs Review Labs Reviewed - No data to display  Imaging Review No results found.   Visual Acuity Review  Right Eye Distance:   Left Eye Distance:  Bilateral Distance:    Right Eye Near:   Left Eye Near:    Bilateral Near:         MDM   1. Wound check, abscess   2. Candidal vulvovaginitis    Packing removal and wound care/dressing per provider. Start taking your ABX Healing well Wound care instructions given Meds ordered this encounter  Medications  . fluconazole (DIFLUCAN) 150 MG tablet    Sig: 1 tab po x 1. May repeat in 72 hours if no improvement    Dispense:  2 tablet    Refill:  0     Order Specific Question:   Supervising Provider    Answer:   Linna HoffKINDL, JAMES D [[id number]]        Hayden Rasmussenavid Duayne Brideau, NP 02/16/16 1204

## 2016-02-17 LAB — CULTURE, BLOOD (ROUTINE X 2)
CULTURE: NO GROWTH
Culture: NO GROWTH

## 2016-02-18 NOTE — ED Notes (Signed)
Pt  Requesting  One  Day  Extension   On work/ school  Note   Dr  Chaney Mallingmortenson      Notified  ok
